# Patient Record
Sex: Male | Born: 1990 | Hispanic: Yes | Marital: Single | Smoking: Never smoker
Health system: Southern US, Community
[De-identification: ages and names within clinical notes are randomized; demographics above are authoritative.]

## PROBLEM LIST (undated history)

## (undated) ENCOUNTER — Emergency Department (HOSPITAL_COMMUNITY): Admission: EM | Payer: Self-pay | Source: Home / Self Care

## (undated) DIAGNOSIS — I639 Cerebral infarction, unspecified: Secondary | ICD-10-CM

## (undated) HISTORY — PX: OTHER SURGICAL HISTORY: SHX169

---

## 2012-01-26 ENCOUNTER — Emergency Department (HOSPITAL_COMMUNITY): Payer: Self-pay

## 2012-01-26 ENCOUNTER — Emergency Department (HOSPITAL_COMMUNITY)
Admission: EM | Admit: 2012-01-26 | Discharge: 2012-01-26 | Disposition: A | Discharge status: Nursing Home (Includes ICF) | Payer: Self-pay | Attending: Emergency Medicine | Admitting: Emergency Medicine

## 2012-01-26 ENCOUNTER — Encounter (HOSPITAL_COMMUNITY): Payer: Self-pay | Admitting: *Deleted

## 2012-01-26 DIAGNOSIS — R404 Transient alteration of awareness: Secondary | ICD-10-CM | POA: Insufficient documentation

## 2012-01-26 DIAGNOSIS — S42109A Fracture of unspecified part of scapula, unspecified shoulder, initial encounter for closed fracture: Secondary | ICD-10-CM

## 2012-01-26 DIAGNOSIS — IMO0002 Reserved for concepts with insufficient information to code with codable children: Secondary | ICD-10-CM | POA: Insufficient documentation

## 2012-01-26 DIAGNOSIS — S45101A Unspecified injury of brachial artery, right side, initial encounter: Secondary | ICD-10-CM

## 2012-01-26 DIAGNOSIS — M25519 Pain in unspecified shoulder: Secondary | ICD-10-CM

## 2012-01-26 DIAGNOSIS — S82209B Unspecified fracture of shaft of unspecified tibia, initial encounter for open fracture type I or II: Secondary | ICD-10-CM

## 2012-01-26 DIAGNOSIS — R000 Tachycardia, unspecified: Secondary | ICD-10-CM | POA: Insufficient documentation

## 2012-01-26 DIAGNOSIS — S42009A Fracture of unspecified part of unspecified clavicle, initial encounter for closed fracture: Secondary | ICD-10-CM | POA: Insufficient documentation

## 2012-01-26 DIAGNOSIS — S82409B Unspecified fracture of shaft of unspecified fibula, initial encounter for open fracture type I or II: Secondary | ICD-10-CM

## 2012-01-26 DIAGNOSIS — J939 Pneumothorax, unspecified: Secondary | ICD-10-CM

## 2012-01-26 DIAGNOSIS — T07XXXA Unspecified multiple injuries, initial encounter: Secondary | ICD-10-CM

## 2012-01-26 DIAGNOSIS — S2239XA Fracture of one rib, unspecified side, initial encounter for closed fracture: Secondary | ICD-10-CM

## 2012-01-26 DIAGNOSIS — J9383 Other pneumothorax: Secondary | ICD-10-CM | POA: Insufficient documentation

## 2012-01-26 DIAGNOSIS — S2249XA Multiple fractures of ribs, unspecified side, initial encounter for closed fracture: Secondary | ICD-10-CM

## 2012-01-26 DIAGNOSIS — S82899B Other fracture of unspecified lower leg, initial encounter for open fracture type I or II: Secondary | ICD-10-CM | POA: Insufficient documentation

## 2012-01-26 LAB — URINALYSIS, MICROSCOPIC ONLY
Glucose, UA: NEGATIVE mg/dL
Leukocytes, UA: NEGATIVE
pH: 6.5 (ref 5.0–8.0)

## 2012-01-26 LAB — POCT I-STAT, CHEM 8
BUN: 11 mg/dL (ref 6–23)
Creatinine, Ser: 1.4 mg/dL — ABNORMAL HIGH (ref 0.50–1.35)
Hemoglobin: 15 g/dL (ref 13.0–17.0)
Potassium: 3.5 mEq/L (ref 3.5–5.1)
Sodium: 149 mEq/L — ABNORMAL HIGH (ref 135–145)

## 2012-01-26 LAB — SAMPLE TO BLOOD BANK

## 2012-01-26 LAB — CBC
Hemoglobin: 14.3 g/dL (ref 13.0–17.0)
RBC: 4.68 MIL/uL (ref 4.22–5.81)

## 2012-01-26 LAB — COMPREHENSIVE METABOLIC PANEL
AST: 208 U/L — ABNORMAL HIGH (ref 0–37)
BUN: 11 mg/dL (ref 6–23)
Chloride: 107 mEq/L (ref 96–112)
GFR calc Af Amer: >90 mL/min (ref >90)
GFR calc non Af Amer: >90 mL/min (ref >90)
Potassium: 3.2 mEq/L — ABNORMAL LOW (ref 3.5–5.1)
Sodium: 146 mEq/L — ABNORMAL HIGH (ref 135–145)

## 2012-01-26 MED ORDER — IOHEXOL 300 MG/ML  SOLN
100.0000 mL | Freq: Once | INTRAMUSCULAR | Status: AC | PRN
  Administered 2012-01-26: 100 mL via INTRAVENOUS

## 2012-01-26 MED ORDER — FENTANYL CITRATE 0.05 MG/ML IJ SOLN
50.0000 ug | Freq: Once | INTRAMUSCULAR | Status: AC
  Administered 2012-01-26: 50 ug via INTRAVENOUS
  Filled 2012-01-26: qty 2

## 2012-01-26 MED ORDER — CEFAZOLIN SODIUM 1-5 GM-% IV SOLN
1.0000 g | Freq: Once | INTRAVENOUS | Status: AC
  Administered 2012-01-26: 1 g via INTRAVENOUS
  Filled 2012-01-26: qty 50

## 2012-01-26 MED ORDER — TETANUS-DIPHTH-ACELL PERTUSSIS 5-2.5-18.5 LF-MCG/0.5 IM SUSP
0.5000 mL | Freq: Once | INTRAMUSCULAR | Status: AC
  Administered 2012-01-26: 0.5 mL via INTRAMUSCULAR
  Filled 2012-01-26: qty 0.5

## 2012-01-26 MED ORDER — SODIUM CHLORIDE 0.9 % IV BOLUS (SEPSIS)
1000.0000 mL | Freq: Once | INTRAVENOUS | Status: AC
  Administered 2012-01-26: 1000 mL via INTRAVENOUS

## 2012-01-26 MED ORDER — GENTAMICIN SULFATE 40 MG/ML IJ SOLN
300.0000 mg | Freq: Once | INTRAMUSCULAR | Status: AC
  Administered 2012-01-26: 300 mg via INTRAVENOUS
  Filled 2012-01-26: qty 7.5

## 2012-01-26 NOTE — ED Notes (Signed)
Pt cell phone, wallet, shoes given to friend "ozzie" at the bedside
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
## 2012-01-26 NOTE — ED Notes (Signed)
Ortho tech at bedside, and splint applied to right lower extremity

## 2012-01-26 NOTE — ED Notes (Signed)
Pt transported to CT with RN and monitor.  °

## 2012-01-26 NOTE — ED Notes (Signed)
[employer] at bedside on arrival

## 2012-01-26 NOTE — ED Notes (Signed)
Called [facility] and spoke to [facility] to please let pt friend Jose Yu know that the patient left behind a phone charger and ID and it will be up at front desk waiting on him
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
## 2012-01-26 NOTE — ED Notes (Signed)
Pt arrived back from ct. Dr. Andrey Campanile is at the bedside. Change in patient right radial/brachial pulse noted. Dopplarable only at this time, MD aware of the same.  Also, changing pt to ASPEN collar per orders by MD
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
## 2013-06-23 ENCOUNTER — Ambulatory Visit: Payer: Self-pay

## 2014-12-12 ENCOUNTER — Emergency Department (HOSPITAL_COMMUNITY)
Admission: EM | Admit: 2014-12-12 | Discharge: 2014-12-13 | Disposition: A | Discharge status: Home / Self Care | Payer: Self-pay | Attending: Emergency Medicine | Admitting: Emergency Medicine

## 2014-12-12 ENCOUNTER — Encounter (HOSPITAL_COMMUNITY): Payer: Self-pay | Admitting: Emergency Medicine

## 2014-12-12 DIAGNOSIS — Y92410 Unspecified street and highway as the place of occurrence of the external cause: Secondary | ICD-10-CM | POA: Insufficient documentation

## 2014-12-12 DIAGNOSIS — R000 Tachycardia, unspecified: Secondary | ICD-10-CM | POA: Insufficient documentation

## 2014-12-12 DIAGNOSIS — W1839XA Other fall on same level, initial encounter: Secondary | ICD-10-CM | POA: Insufficient documentation

## 2014-12-12 DIAGNOSIS — S60511A Abrasion of right hand, initial encounter: Secondary | ICD-10-CM | POA: Insufficient documentation

## 2014-12-12 DIAGNOSIS — F919 Conduct disorder, unspecified: Secondary | ICD-10-CM | POA: Insufficient documentation

## 2014-12-12 DIAGNOSIS — F1410 Cocaine abuse, uncomplicated: Secondary | ICD-10-CM

## 2014-12-12 DIAGNOSIS — Z720 Tobacco use: Secondary | ICD-10-CM | POA: Insufficient documentation

## 2014-12-12 DIAGNOSIS — Y998 Other external cause status: Secondary | ICD-10-CM | POA: Insufficient documentation

## 2014-12-12 DIAGNOSIS — S0001XA Abrasion of scalp, initial encounter: Secondary | ICD-10-CM | POA: Insufficient documentation

## 2014-12-12 DIAGNOSIS — Y9302 Activity, running: Secondary | ICD-10-CM | POA: Insufficient documentation
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
## 2014-12-13 LAB — CBC WITH DIFFERENTIAL/PLATELET
BASOS ABS: 0 10*3/uL (ref 0.0–0.1)
Basophils Relative: 0 % (ref 0–1)
Eosinophils Absolute: 0.3 10*3/uL (ref 0.0–0.7)
Eosinophils Relative: 3 % (ref 0–5)
HEMATOCRIT: 47.9 % (ref 39.0–52.0)
HEMOGLOBIN: 16.5 g/dL (ref 13.0–17.0)
LYMPHS ABS: 3.9 10*3/uL (ref 0.7–4.0)
LYMPHS PCT: 45 % (ref 12–46)
MCH: 31.4 pg (ref 26.0–34.0)
MCHC: 34.4 g/dL (ref 30.0–36.0)
MCV: 91.2 fL (ref 78.0–100.0)
Monocytes Absolute: 0.8 10*3/uL (ref 0.1–1.0)
Monocytes Relative: 9 % (ref 3–12)
Neutro Abs: 3.8 10*3/uL (ref 1.7–7.7)
Neutrophils Relative %: 43 % (ref 43–77)
PLATELETS: 232 10*3/uL (ref 150–400)
RBC: 5.25 MIL/uL (ref 4.22–5.81)
RDW: 12.3 % (ref 11.5–15.5)
WBC: 8.8 10*3/uL (ref 4.0–10.5)

## 2014-12-13 LAB — RAPID URINE DRUG SCREEN, HOSP PERFORMED
Amphetamines: NOT DETECTED
BARBITURATES: NOT DETECTED
Benzodiazepines: NOT DETECTED
Cocaine: POSITIVE — AB
Opiates: NOT DETECTED
TETRAHYDROCANNABINOL: NOT DETECTED

## 2014-12-13 LAB — URINALYSIS, ROUTINE W REFLEX MICROSCOPIC
Bilirubin Urine: NEGATIVE
GLUCOSE, UA: NEGATIVE mg/dL
HGB URINE DIPSTICK: NEGATIVE
Ketones, ur: NEGATIVE mg/dL
LEUKOCYTES UA: NEGATIVE
Nitrite: NEGATIVE
PH: 6.5 (ref 5.0–8.0)
PROTEIN: NEGATIVE mg/dL
SPECIFIC GRAVITY, URINE: 1.005 (ref 1.005–1.030)
Urobilinogen, UA: 0.2 mg/dL (ref 0.0–1.0)

## 2014-12-13 LAB — I-STAT CG4 LACTIC ACID, ED
LACTIC ACID, VENOUS: 0.9 mmol/L (ref 0.5–2.0)
LACTIC ACID, VENOUS: 3.41 mmol/L — AB (ref 0.5–2.0)

## 2014-12-13 LAB — COMPREHENSIVE METABOLIC PANEL
ALBUMIN: 4.6 g/dL (ref 3.5–5.0)
ALT: 83 U/L — AB (ref 17–63)
ANION GAP: 15 (ref 5–15)
AST: 98 U/L — AB (ref 15–41)
Alkaline Phosphatase: 172 U/L — ABNORMAL HIGH (ref 38–126)
BILIRUBIN TOTAL: 0.5 mg/dL (ref 0.3–1.2)
BUN: 10 mg/dL (ref 6–20)
CALCIUM: 9.2 mg/dL (ref 8.9–10.3)
CHLORIDE: 108 mmol/L (ref 101–111)
CO2: 22 mmol/L (ref 22–32)
CREATININE: 0.98 mg/dL (ref 0.61–1.24)
Glucose, Bld: 140 mg/dL — ABNORMAL HIGH (ref 70–99)
Potassium: 3.2 mmol/L — ABNORMAL LOW (ref 3.5–5.1)
SODIUM: 145 mmol/L (ref 135–145)
Total Protein: 8.1 g/dL (ref 6.5–8.1)

## 2014-12-13 LAB — ETHANOL: Alcohol, Ethyl (B): 399 mg/dL (ref <5)

## 2014-12-13 MED ORDER — LORAZEPAM 2 MG/ML IJ SOLN
1.0000 mg | Freq: Once | INTRAMUSCULAR | Status: AC
  Administered 2014-12-13: 1 mg via INTRAVENOUS
  Filled 2014-12-13: qty 1

## 2014-12-13 MED ORDER — SODIUM CHLORIDE 0.9 % IV BOLUS (SEPSIS)
1000.0000 mL | INTRAVENOUS | Status: AC
  Administered 2014-12-13: 1000 mL via INTRAVENOUS

## 2014-12-13 MED ORDER — HALOPERIDOL LACTATE 5 MG/ML IJ SOLN
5.0000 mg | INTRAMUSCULAR | Status: AC
  Administered 2014-12-13: 5 mg via INTRAVENOUS
  Filled 2014-12-13: qty 1

## 2014-12-13 MED ORDER — SODIUM CHLORIDE 0.9 % IV BOLUS (SEPSIS)
2000.0000 mL | INTRAVENOUS | Status: AC
  Administered 2014-12-13: 2000 mL via INTRAVENOUS

## 2014-12-13 NOTE — ED Provider Notes (Signed)
CSN: [id number]     Arrival date & time 12/12/14  2339 History   First MD Initiated Contact with Patient 12/12/14 2350     Chief Complaint  Patient presents with  . Altered Mental Status   (Consider location/radiation/quality/duration/timing/severity/associated sxs/prior Treatment) HPI  Level V Caveat    Jose Yu is a 24 yo male presenting with [facility] with altered mental status and tachycardia.  Pt reportedly was found running down the street after being found unresponsive by bystanders.  During transport he was combative with EMS and repeated words in Spanish and threatening gestures.  Using the translator phone, pt can answer his name but repeatedly answers to all other questions "I didn't do anything, I want to go home." " I have answered your questions, I am not answering any more".  [employer] report suspected crack use.    History reviewed. No pertinent past medical history. History reviewed. No pertinent past surgical history. History reviewed. No pertinent family history. History  Substance Use Topics  . Smoking status: Current Some Day Smoker  . Smokeless tobacco: Not on file  . Alcohol Use: Yes     Comment: drinks several times a week    Review of Systems  Unable to perform ROS: Psychiatric disorder    Allergies  Review of patient's allergies indicates no known allergies.  Home Medications   Prior to Admission medications   Not on File   BP 161/87 mmHg  Pulse 142  Temp(Src) 98 F (36.7 C) (Axillary)  Resp 22  SpO2 98% Physical Exam  Constitutional: He appears well-developed and well-nourished. No distress.  HENT:  Head: Normocephalic and atraumatic.  Eyes: Conjunctivae are normal. Pupils are equal, round, and reactive to light.  Neck: Neck supple.  Cardiovascular: Regular rhythm and intact distal pulses.  Tachycardia present.   Pulmonary/Chest: Effort normal and breath sounds normal. No respiratory distress. He has no wheezes. He has no rales. He  exhibits no tenderness.  Abdominal: Soft. There is no tenderness.  Musculoskeletal: He exhibits no tenderness.  Healed surgical scar on right anterior shoulder, right arm atrophy.  Lymphadenopathy:    He has no cervical adenopathy.  Neurological: He is alert.  Skin: Skin is warm and dry. No rash noted. He is not diaphoretic.  Various superficial abrasion to right temporal scalp and dorsal aspect of right hand  Psychiatric: His affect is angry. He is aggressive and combative. He expresses impulsivity.  Nursing note and vitals reviewed.   ED Course  Procedures (including critical care time) Labs Review Labs Reviewed  COMPREHENSIVE METABOLIC PANEL - Abnormal; Notable for the following:    Potassium 3.2 (*)    Glucose, Bld 140 (*)    AST 98 (*)    ALT 83 (*)    Alkaline Phosphatase 172 (*)    All other components within normal limits  URINE RAPID DRUG SCREEN (HOSP PERFORMED) - Abnormal; Notable for the following:    Cocaine POSITIVE (*)    All other components within normal limits  ETHANOL - Abnormal; Notable for the following:    Alcohol, Ethyl (B) 399 (*)    All other components within normal limits  I-STAT CG4 LACTIC ACID, ED - Abnormal; Notable for the following:    Lactic Acid, Venous 3.41 (*)    All other components within normal limits  CBC WITH DIFFERENTIAL/PLATELET  URINALYSIS, ROUTINE W REFLEX MICROSCOPIC  I-STAT CG4 LACTIC ACID, ED    Imaging Review No results found.   EKG Interpretation   Date/Time:  Sunday Dec 12 2014 23:42:35 EDT Ventricular Rate:  138 PR Interval:  136 QRS Duration: 90 QT Interval:  289 QTC Calculation: 438 R Axis:   27 Text Interpretation:  Sinus tachycardia Minimal ST depression, lateral  leads No old tracing to compare Confirmed by [facility]  MD, DAVID ([id number]) on  12/13/2014 12:30:56 AM      MDM   Final diagnoses:  Drug abuse, cocaine type   24 yo clinically intoxicated, Spanish speaking, with tachycardia and unwilling to  cooperate with exam or history. CBC, CMP, Lactic acid, UA, UDS, 2 liter NS. Case discussed with Dr. Roxanne Mins  0:99 AM: Labs reviewed Mildly decreased potassium: 3.2, AST, ALT and alk Phos elevated consistent with alcohol intake. Initial Lactic elevated to 3.41 and ETOH: 399. WDS: positive for cocaine Will continue fluid resuscitation.  Pt increasingly more agitated despite attempts with interpreter phone to re-orient. Pt grabbed staff and was restrained by police and subsequently spit in his face. Pt was placed in restraints and Haldol given.   3:00 AM: Pt sleeping without distress, hypotension after sedation meds reported, additional fluid bolus given.    6:00 AM: Pt more alert,  re-oriented using interpreter phone, ED course and results discussed with pt.  Pt ambulated in ED without difficulty.  Pt denies shortness of breath, chest pain, nausea, headache or other complaint. Pt is well-appearing, in no acute distress and vital signs reviewed and not concerning. He appears safe to be discharged.  Discharge include follow-up with their PCP.  Return precautions provided.  He is discharged from the ED in the custody of the police.     Filed Vitals:   12/13/14 0118 12/13/14 0347 12/13/14 0350 12/13/14 0521  BP: 96/62 83/48 108/44 117/67  Pulse: 119 105 104 116  Temp:      TempSrc:      Resp: _0 SpO2: 100% 98% 97% 95%   Meds given in ED:  Medications  sodium chloride 0.9 % bolus 2,000 mL (0 mLs Intravenous Stopped 12/13/14 0353)  LORazepam (ATIVAN) injection 1 mg (1 mg Intravenous Given 12/13/14 0042)  haloperidol lactate (HALDOL) injection 5 mg (5 mg Intravenous Given 12/13/14 0110)  sodium chloride 0.9 % bolus 1,000 mL (0 mLs Intravenous Stopped 12/13/14 0454)    New Prescriptions   No medications on file       Britt Bottom, NP 83/38/25 0539  Delora Fuel, MD 76/73/41 [id number]

## 2014-12-13 NOTE — Discharge Instructions (Signed)
Please follow the directions provided. Use the resource guide to follow-up with a primary care doctor. Don't hesitate to return for any new, worsening or concerning symptoms.      SEEK IMMEDIATE MEDICAL CARE IF:  You have serious thoughts about hurting yourself or others.  You have a seizure, chest pain, sudden weakness, or loss of speech or vision.

## 2014-12-13 NOTE — ED Notes (Signed)
NP Beth notified of BP. NS Bolus verbal order obtained will continue to monitor.

## 2014-12-13 NOTE — ED Notes (Signed)
Pt alert, oriented, and ambulatory upon DC. He is discharged in the care of [employer]. Interpreter phone used for discharged instructions.

## 2014-12-13 NOTE — ED Notes (Signed)
Pt sleeping. Rise and fall noted to chest. Pt responds to stimuli

## 2017-05-11 ENCOUNTER — Encounter (HOSPITAL_COMMUNITY): Payer: Self-pay | Admitting: Emergency Medicine

## 2017-05-11 ENCOUNTER — Emergency Department (HOSPITAL_COMMUNITY)
Admission: EM | Admit: 2017-05-11 | Discharge: 2017-05-11 | Disposition: A | Discharge status: Home / Self Care | Payer: Self-pay | Attending: Emergency Medicine | Admitting: Emergency Medicine

## 2017-05-11 DIAGNOSIS — M79642 Pain in left hand: Secondary | ICD-10-CM | POA: Insufficient documentation

## 2017-05-11 DIAGNOSIS — Z5321 Procedure and treatment not carried out due to patient leaving prior to being seen by health care provider: Secondary | ICD-10-CM | POA: Insufficient documentation

## 2017-05-11 NOTE — ED Triage Notes (Addendum)
Patient called EMS due to having a cat bite to his left hand. Patient does not speak english. Patient is complaining of pain in the left hand. Brothers are at bedside.

## 2017-05-11 NOTE — ED Notes (Signed)
Patient left with brothers because he did not want to wait anymore.

## 2017-05-11 NOTE — ED Provider Notes (Signed)
1:45 AM I went to see patient and he had left without being seen. I did not evaluate this patient.    Pricilla Loveless, MD 05/11/17 [phone]

## 2017-05-11 NOTE — ED Notes (Signed)
Patient states he was bit by a cat but patient came by EMS. Patient does not want to be seen. Patient is intoxicated and will be released with brother when he is discharged.

## 2017-05-11 NOTE — ED Notes (Signed)
Bed: WA03 Expected date:  Expected time:  Means of arrival:  Comments: 

## 2017-08-02 ENCOUNTER — Encounter (HOSPITAL_COMMUNITY): Payer: Self-pay

## 2017-08-02 ENCOUNTER — Emergency Department (HOSPITAL_COMMUNITY): Payer: Self-pay

## 2017-08-02 ENCOUNTER — Emergency Department (HOSPITAL_COMMUNITY)
Admission: EM | Admit: 2017-08-02 | Discharge: 2017-08-02 | Disposition: A | Discharge status: Home / Self Care | Payer: Self-pay | Attending: Emergency Medicine | Admitting: Emergency Medicine

## 2017-08-02 ENCOUNTER — Other Ambulatory Visit: Payer: Self-pay

## 2017-08-02 DIAGNOSIS — Y929 Unspecified place or not applicable: Secondary | ICD-10-CM | POA: Insufficient documentation

## 2017-08-02 DIAGNOSIS — Y998 Other external cause status: Secondary | ICD-10-CM | POA: Insufficient documentation

## 2017-08-02 DIAGNOSIS — S82831A Other fracture of upper and lower end of right fibula, initial encounter for closed fracture: Secondary | ICD-10-CM | POA: Insufficient documentation

## 2017-08-02 DIAGNOSIS — S82891A Other fracture of right lower leg, initial encounter for closed fracture: Secondary | ICD-10-CM

## 2017-08-02 DIAGNOSIS — Y939 Activity, unspecified: Secondary | ICD-10-CM | POA: Insufficient documentation

## 2017-08-02 DIAGNOSIS — F17200 Nicotine dependence, unspecified, uncomplicated: Secondary | ICD-10-CM | POA: Insufficient documentation

## 2017-08-02 DIAGNOSIS — Y33XXXA Other specified events, undetermined intent, initial encounter: Secondary | ICD-10-CM | POA: Insufficient documentation

## 2017-08-02 MED ORDER — HYDROCODONE-ACETAMINOPHEN 5-325 MG PO TABS: 1.0000 | ORAL_TABLET | Freq: Four times a day (QID) | ORAL | 0 refills | Status: DC | PRN

## 2017-08-02 NOTE — ED Provider Notes (Signed)
[facility]-EMERGENCY DEPT Provider Note   CSN: [id number] Arrival date & time: 08/02/17  1850     History   Chief Complaint Chief Complaint  Patient presents with  . Ankle Pain    HPI Jose Yu is a 26 y.o. male who presents today for evaluation of sudden onset right ankle pain.  He reports that he stood up and twisted his ankle causing him severe pain.  He reports 10 out of 10 pain in his right ankle.  He reports that he has pain in his ankle along with swelling.  He denies any other injuries, no knee pain, did not fall or strike his head. Interpreter used to obtain HPI.  HPI  History reviewed. No pertinent past medical history.  There are no active problems to display for this patient.   History reviewed. No pertinent surgical history.     Home Medications    Prior to Admission medications   Medication Sig Start Date End Date Taking? Authorizing Provider  HYDROcodone-acetaminophen (NORCO/VICODIN) 5-325 MG tablet Take 1 tablet by mouth every 6 (six) hours as needed. 08/02/17   Cristina GongHammond, Mollye Guinta W, PA-C    Family History History reviewed. No pertinent family history.  Social History Social History   Tobacco Use  . Smoking status: Current Some Day Smoker  . Smokeless tobacco: Never Used  Substance Use Topics  . Alcohol use: Yes    Comment: drinks several times a week  . Drug use: Yes    Comment: rare THC     Allergies   Patient has no known allergies.   Review of Systems Review of Systems  Musculoskeletal: Positive for arthralgias, gait problem and joint swelling.  Skin: Negative for color change, rash and wound.  Neurological: Negative for weakness and numbness.  All other systems reviewed and are negative.    Physical Exam Updated Vital Signs BP (!) 154/81 (BP Location: Right Arm)   Pulse 81   Temp 97.8 F (36.6 C) (Oral)   Resp 14   SpO2 97%   Physical Exam  Constitutional: He appears well-developed and  well-nourished. No distress.  HENT:  Head: Normocephalic and atraumatic.  Eyes: Conjunctivae are normal. Right eye exhibits no discharge. Left eye exhibits no discharge. No scleral icterus.  Neck: Normal range of motion.  Cardiovascular: Normal rate, regular rhythm and intact distal pulses.  2+ DP/PT pulses on right side  Pulmonary/Chest: Effort normal. No stridor. No respiratory distress.  Abdominal: He exhibits no distension.  Musculoskeletal: He exhibits no edema or deformity.  Mild swelling to right ankle bilaterally.  Patient is able to wiggle his toes.  Right lower leg compartments are soft and easily compressible.  Neurological: He is alert. No sensory deficit. He exhibits normal muscle tone.  Sensation intact to right lower extremity, toes, and foot.   Skin: Skin is warm and dry. He is not diaphoretic.  Psychiatric: He has a normal mood and affect. His behavior is normal.  Nursing note and vitals reviewed.    ED Treatments / Results  Labs (all labs ordered are listed, but only abnormal results are displayed) Labs Reviewed - No data to display  EKG  EKG Interpretation None       Radiology Dg Ankle Complete Right  Result Date: 08/02/2017 CLINICAL DATA:  26 year old male with fall and right ankle twisting. EXAM: RIGHT ANKLE - COMPLETE 3+ VIEW COMPARISON:  Right ankle radiograph dated 01/26/2012 FINDINGS: There is a mildly displaced oblique acute fracture of the distal fibula. Apparent  cortical discontinuity of the posterior malleolus on the lateral view may be artifactual or less likely represent a mildly displaced fracture. This area is difficult to assess due to suboptimal visualization. Old healed fracture of the mid tibia and fibula as well as partially visualized intramedullary rod and distal fixation screws in the tibia. There is approximately 5 mm lateral subluxation of the ankle mortise. There is diffuse soft tissue swelling of the ankle. IMPRESSION: 1. Acute displaced  oblique fracture of the distal fibula. 2. Artifact versus less likely a mildly displaced fracture of the posterior malleolus of the distal tibia. 3. A 5 mm lateral subluxation of the ankle. Electronically Signed   By: Elgie CollardArash  Radparvar M.D.   On: 08/02/2017 19:58    Procedures Procedures (including critical care time)  Medications Ordered in ED Medications - No data to display   Initial Impression / Assessment and Plan / ED Course  I have reviewed the triage vital signs and the nursing notes.  Pertinent labs & imaging results that were available during my care of the patient were reviewed by me and considered in my medical decision making (see chart for details).    Jose Yu presents today for evaluation of sudden onset right ankle pain when he stood up and twisted his ankle to turn.  X-rays were obtained showing fracture.  Placement is unlikely to be corrected by ER reduction.  Patient pain was treated prior to arrival with fentanyl.  He is right foot was neurovascularly intact, and he does not have any proximal right lower leg pain/knee pain.  He was given a Cam walker and crutches.  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  A platform walker was discussed with the patient, however he declined.  Patient was instructed to be nonweightbearing and informed that failure to follow instructions could result in worsening fracture.  He was also informed that his blood pressure was high and he needed to obtain follow-up regarding this.  He was given a short course of Norco for his pain, along with instructions and appropriately alternating ibuprofen and Tylenol, which were explained through interpreter.  Orthopedics follow-up.  Discharged home.  Return precautions given.   Final Clinical Impressions(s) / ED Diagnoses   Final diagnoses:  Closed fracture of right ankle, initial encounter    ED Discharge Orders         Ordered    HYDROcodone-acetaminophen (NORCO/VICODIN) 5-325 MG tablet  Every 6 hours PRN     08/02/17 2205       Cristina GongHammond, [place] Doig W, PA-C 08/03/17 0250    Linwood DibblesKnapp, Jon, MD 08/03/17 [phone]

## 2017-08-02 NOTE — ED Triage Notes (Signed)
Patient BIB EMS with right ankle pain. Patient states he was sitting and when he stood up, he twisted his ankle to turn and fell. Patient complaining of pain 10/10 to his right ankle. Patient has history of previous fracture with pins to right ankle. 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 EMS placed 18G L AC- 200mcg IV Fentanyl administered en route. Swelling noted to right ankle in triage. Patient is spanish speaking only.

## 2017-08-02 NOTE — Discharge Instructions (Signed)

## 2018-12-08 ENCOUNTER — Other Ambulatory Visit: Payer: Self-pay

## 2018-12-08 ENCOUNTER — Emergency Department (HOSPITAL_COMMUNITY): Payer: Self-pay

## 2018-12-08 ENCOUNTER — Encounter (HOSPITAL_COMMUNITY): Payer: Self-pay | Admitting: Emergency Medicine

## 2018-12-08 ENCOUNTER — Emergency Department (HOSPITAL_COMMUNITY)
Admission: EM | Admit: 2018-12-08 | Discharge: 2018-12-08 | Disposition: A | Discharge status: Home / Self Care | Payer: Self-pay | Attending: Emergency Medicine | Admitting: Emergency Medicine

## 2018-12-08 DIAGNOSIS — Z23 Encounter for immunization: Secondary | ICD-10-CM | POA: Insufficient documentation

## 2018-12-08 DIAGNOSIS — Y9389 Activity, other specified: Secondary | ICD-10-CM | POA: Insufficient documentation

## 2018-12-08 DIAGNOSIS — Y999 Unspecified external cause status: Secondary | ICD-10-CM | POA: Insufficient documentation

## 2018-12-08 DIAGNOSIS — Y929 Unspecified place or not applicable: Secondary | ICD-10-CM | POA: Insufficient documentation

## 2018-12-08 DIAGNOSIS — W25XXXA Contact with sharp glass, initial encounter: Secondary | ICD-10-CM | POA: Insufficient documentation

## 2018-12-08 DIAGNOSIS — F17210 Nicotine dependence, cigarettes, uncomplicated: Secondary | ICD-10-CM | POA: Insufficient documentation

## 2018-12-08 DIAGNOSIS — S61412A Laceration without foreign body of left hand, initial encounter: Secondary | ICD-10-CM | POA: Insufficient documentation

## 2018-12-08 LAB — CBC WITH DIFFERENTIAL/PLATELET
Abs Immature Granulocytes: 0.01 10*3/uL (ref 0.00–0.07)
Basophils Absolute: 0.1 10*3/uL (ref 0.0–0.1)
Basophils Relative: 1 %
Eosinophils Absolute: 0.1 10*3/uL (ref 0.0–0.5)
Eosinophils Relative: 3 %
HCT: 46.3 % (ref 39.0–52.0)
Hemoglobin: 15.7 g/dL (ref 13.0–17.0)
Immature Granulocytes: 0 %
Lymphocytes Relative: 46 %
Lymphs Abs: 2.4 10*3/uL (ref 0.7–4.0)
MCH: 31.7 pg (ref 26.0–34.0)
MCHC: 33.9 g/dL (ref 30.0–36.0)
MCV: 93.5 fL (ref 80.0–100.0)
Monocytes Absolute: 0.5 10*3/uL (ref 0.1–1.0)
Monocytes Relative: 10 %
Neutro Abs: 2 10*3/uL (ref 1.7–7.7)
Neutrophils Relative %: 40 %
Platelets: 239 10*3/uL (ref 150–400)
RBC: 4.95 MIL/uL (ref 4.22–5.81)
RDW: 12.9 % (ref 11.5–15.5)
WBC: 5.1 10*3/uL (ref 4.0–10.5)
nRBC: 0 % (ref 0.0–0.2)

## 2018-12-08 MED ORDER — TETANUS-DIPHTH-ACELL PERTUSSIS 5-2.5-18.5 LF-MCG/0.5 IM SUSP
0.5000 mL | Freq: Once | INTRAMUSCULAR | Status: AC
Start: 2018-12-08 — End: 2018-12-08
  Administered 2018-12-08: 0.5 mL via INTRAMUSCULAR
  Filled 2018-12-08: qty 0.5

## 2018-12-08 MED ORDER — LIDOCAINE-EPINEPHRINE (PF) 2 %-1:200000 IJ SOLN
10.0000 mL | Freq: Once | INTRAMUSCULAR | Status: AC
  Administered 2018-12-08: 10 mL via INTRADERMAL
  Filled 2018-12-08: qty 20

## 2018-12-08 NOTE — Discharge Instructions (Addendum)
Suture removal in 7 days. Come back to the ER or go to the urgent care to have your suture removed.

## 2018-12-08 NOTE — ED Provider Notes (Signed)
[facility] Provider Note   CSN: [id number] Arrival date & time: 12/08/18  1813    History   Chief Complaint Chief Complaint  Patient presents with  . Extremity Laceration    HPI Renne CriglerMacario Andrade-Cano is a 28 y.o. male.     28yo male brought in by EMS for left hand laceration after punching a window today, reports heavy bleeding on scene. No other injuries, states he punched the window because he was mad. Last tdap unknown.      History reviewed. No pertinent past medical history.  There are no active problems to display for this patient.   History reviewed. No pertinent surgical history.      Home Medications    Prior to Admission medications   Medication Sig Start Date End Date Taking? Authorizing Provider  HYDROcodone-acetaminophen (NORCO/VICODIN) 5-325 MG tablet Take 1 tablet by mouth every 6 (six) hours as needed. 08/02/17   Cristina GongHammond, Elizabeth W, PA-C    Family History History reviewed. No pertinent family history.  Social History Social History   Tobacco Use  . Smoking status: Current Some Day Smoker  . Smokeless tobacco: Never Used  Substance Use Topics  . Alcohol use: Yes    Comment: drinks several times a week  . Drug use: Yes    Comment: rare THC     Allergies   Patient has no known allergies.   Review of Systems Review of Systems  Constitutional: Negative for fever.  Musculoskeletal: Positive for myalgias.  Skin: Positive for wound.  Allergic/Immunologic: Negative for immunocompromised state.  Neurological: Negative for weakness and numbness.  All other systems reviewed and are negative.    Physical Exam Updated Vital Signs BP 115/78   Pulse 99   Temp 98.9 F (37.2 C) (Oral)   Resp 15   SpO2 96%   Physical Exam Vitals signs and nursing note reviewed.  Constitutional:      General: He is not in acute distress.    Appearance: He is well-developed. He is not diaphoretic.  HENT:   Head: Normocephalic and atraumatic.  Cardiovascular:     Pulses: Normal pulses.  Pulmonary:     Effort: Pulmonary effort is normal.  Musculoskeletal: Normal range of motion.        General: Swelling, tenderness and signs of injury present.       Arms:       Hands:  Skin:    General: Skin is warm and dry.     Capillary Refill: Capillary refill takes less than 2 seconds.  Neurological:     General: No focal deficit present.     Mental Status: He is alert and oriented to person, place, and time.  Psychiatric:        Behavior: Behavior normal.      ED Treatments / Results  Labs (all labs ordered are listed, but only abnormal results are displayed) Labs Reviewed  CBC WITH DIFFERENTIAL/PLATELET    EKG None  Radiology Dg Hand Complete Left  Result Date: 12/08/2018 CLINICAL DATA:  28 y/o  M; laceration. EXAM: LEFT HAND - COMPLETE 3+ VIEW COMPARISON:  None. FINDINGS: There is no evidence of fracture or dislocation. There is no evidence of arthropathy or other focal bone abnormality. No radiopaque foreign body identified. IMPRESSION: 1.  No acute fracture or dislocation. 2. No radiopaque foreign body identified. Electronically Signed   By: Mitzi HansenLance  Furusawa-Stratton M.D.   On: 12/08/2018 19:10    Procedures .Marland Kitchen.Laceration Repair Date/Time: 12/08/2018 8:40  PM Performed by: Jeannie Fend, PA-C Authorized by: Jeannie Fend, PA-C   Consent:    Consent obtained:  Verbal   Consent given by:  Patient   Risks discussed:  Infection, need for additional repair, pain, poor cosmetic result and poor wound healing   Alternatives discussed:  No treatment and delayed treatment Universal protocol:    Procedure explained and questions answered to patient or proxy's satisfaction: yes     Relevant documents present and verified: yes     Test results available and properly labeled: yes     Imaging studies available: yes     Required blood products, implants, devices, and special equipment  available: yes     Site/side marked: yes     Immediately prior to procedure, a time out was called: yes     Patient identity confirmed:  Verbally with patient Anesthesia (see MAR for exact dosages):    Anesthesia method:  Local infiltration   Local anesthetic:  Lidocaine 2% WITH epi Laceration details:    Location:  Hand   Hand location:  L hand, dorsum   Length (cm):  0.4   Depth (mm):  3 Repair type:    Repair type:  Simple Pre-procedure details:    Preparation:  Patient was prepped and draped in usual sterile fashion and imaging obtained to evaluate for foreign bodies Exploration:    Hemostasis achieved with:  Epinephrine   Wound exploration: wound explored through full range of motion and entire depth of wound probed and visualized     Wound extent: no foreign bodies/material noted, no muscle damage noted and no underlying fracture noted     Contaminated: no   Treatment:    Area cleansed with:  Saline   Amount of cleaning:  Standard   Irrigation solution:  Sterile saline Skin repair:    Repair method:  Sutures   Suture size:  4-0   Suture material:  Nylon   Suture technique:  Simple interrupted   Number of sutures:  1 Approximation:    Approximation:  Close Post-procedure details:    Dressing:  Bulky dressing   Patient tolerance of procedure:  Tolerated well, no immediate complications   (including critical care time)  Medications Ordered in ED Medications  Tdap (BOOSTRIX) injection 0.5 mL (0.5 mLs Intramuscular Given 12/08/18 1935)  lidocaine-EPINEPHrine (XYLOCAINE W/EPI) 2 %-1:200000 (PF) injection 10 mL (10 mLs Intradermal Given by Other 12/08/18 1939)     Initial Impression / Assessment and Plan / ED Course  I have reviewed the triage vital signs and the nursing notes.  Pertinent labs & imaging results that were available during my care of the patient were reviewed by me and considered in my medical decision making (see chart for details).  Clinical Course as of  Dec 08 2043  Mon Dec 08, 2018  4747 28 year old male with laceration to the dorsum of the left hand.  Bleeding controlled upon arrival. Called to room, staff was cleaning wound when bleeding resumed, noted to be heavy, bleeding controlled with pressure. CBC checked due to history of alcohol use, prolonged/heavy bleeding, CBC WNL. Wound irrigated, anesthetized with lidocaine with epi, bleeding controlled, closed with 1 suture. Recommend removal in 1 week with UC/PCP/ER. Tdap updated.    [LM]    Clinical Course User Index [LM] Jeannie Fend, PA-C      Final Clinical Impressions(s) / ED Diagnoses   Final diagnoses:  Laceration of left hand without foreign body, initial encounter  ED Discharge Orders    None       Alden Hipp 12/08/18 2045    Gerhard Munch, MD 12/09/18 [place] Bruns

## 2018-12-08 NOTE — ED Triage Notes (Signed)
Pt arrives to ED from home with complaints of punching through a glass window. EMS reports laceration to patients left hand. Bleeding is currently controlled and pulses are palpable.

## 2018-12-17 ENCOUNTER — Telehealth: Payer: Self-pay | Admitting: *Deleted

## 2018-12-17 NOTE — Telephone Encounter (Signed)
No answer. Left VM to return call. Possible exposure on 12/08/18.

## 2019-10-29 ENCOUNTER — Other Ambulatory Visit: Payer: Self-pay

## 2019-10-29 ENCOUNTER — Emergency Department (HOSPITAL_COMMUNITY): Payer: Self-pay

## 2019-10-29 ENCOUNTER — Emergency Department (HOSPITAL_COMMUNITY)
Admission: EM | Admit: 2019-10-29 | Discharge: 2019-10-29 | Disposition: A | Discharge status: Home / Self Care | Payer: Self-pay | Attending: Emergency Medicine | Admitting: Emergency Medicine

## 2019-10-29 DIAGNOSIS — S0181XA Laceration without foreign body of other part of head, initial encounter: Secondary | ICD-10-CM | POA: Insufficient documentation

## 2019-10-29 DIAGNOSIS — W19XXXA Unspecified fall, initial encounter: Secondary | ICD-10-CM

## 2019-10-29 DIAGNOSIS — Y999 Unspecified external cause status: Secondary | ICD-10-CM | POA: Insufficient documentation

## 2019-10-29 DIAGNOSIS — F17210 Nicotine dependence, cigarettes, uncomplicated: Secondary | ICD-10-CM | POA: Insufficient documentation

## 2019-10-29 DIAGNOSIS — Y92019 Unspecified place in single-family (private) house as the place of occurrence of the external cause: Secondary | ICD-10-CM | POA: Insufficient documentation

## 2019-10-29 DIAGNOSIS — Y939 Activity, unspecified: Secondary | ICD-10-CM | POA: Insufficient documentation

## 2019-10-29 DIAGNOSIS — Z23 Encounter for immunization: Secondary | ICD-10-CM | POA: Insufficient documentation

## 2019-10-29 DIAGNOSIS — W01198A Fall on same level from slipping, tripping and stumbling with subsequent striking against other object, initial encounter: Secondary | ICD-10-CM | POA: Insufficient documentation

## 2019-10-29 LAB — CBC WITH DIFFERENTIAL/PLATELET
Abs Immature Granulocytes: 0.02 10*3/uL (ref 0.00–0.07)
Basophils Absolute: 0.1 10*3/uL (ref 0.0–0.1)
Basophils Relative: 1 %
Eosinophils Absolute: 0.2 10*3/uL (ref 0.0–0.5)
Eosinophils Relative: 3 %
HCT: 46.5 % (ref 39.0–52.0)
Hemoglobin: 16 g/dL (ref 13.0–17.0)
Immature Granulocytes: 0 %
Lymphocytes Relative: 48 %
Lymphs Abs: 3 10*3/uL (ref 0.7–4.0)
MCH: 30.5 pg (ref 26.0–34.0)
MCHC: 34.4 g/dL (ref 30.0–36.0)
MCV: 88.6 fL (ref 80.0–100.0)
Monocytes Absolute: 0.6 10*3/uL (ref 0.1–1.0)
Monocytes Relative: 9 %
Neutro Abs: 2.5 10*3/uL (ref 1.7–7.7)
Neutrophils Relative %: 39 %
Platelets: 182 10*3/uL (ref 150–400)
RBC: 5.25 MIL/uL (ref 4.22–5.81)
RDW: 13.2 % (ref 11.5–15.5)
WBC: 6.3 10*3/uL (ref 4.0–10.5)
nRBC: 0 % (ref 0.0–0.2)

## 2019-10-29 MED ORDER — TETANUS-DIPHTH-ACELL PERTUSSIS 5-2.5-18.5 LF-MCG/0.5 IM SUSP
0.5000 mL | Freq: Once | INTRAMUSCULAR | Status: AC
  Administered 2019-10-29: 0.5 mL via INTRAMUSCULAR
  Filled 2019-10-29: qty 0.5

## 2019-10-29 MED ORDER — LIDOCAINE-EPINEPHRINE (PF) 2 %-1:200000 IJ SOLN
10.0000 mL | Freq: Once | INTRAMUSCULAR | Status: AC
  Administered 2019-10-29: 10 mL
  Filled 2019-10-29: qty 20

## 2019-10-29 MED ORDER — SODIUM CHLORIDE 0.9 % IV BOLUS
1000.0000 mL | Freq: Once | INTRAVENOUS | Status: AC
  Administered 2019-10-29: 1000 mL via INTRAVENOUS

## 2019-10-29 MED ORDER — LIDOCAINE-EPINEPHRINE-TETRACAINE (LET) TOPICAL GEL
3.0000 mL | Freq: Once | TOPICAL | Status: AC
  Administered 2019-10-29: 3 mL via TOPICAL
  Filled 2019-10-29: qty 3

## 2019-10-29 NOTE — ED Triage Notes (Signed)
Per EMS-they found patient "standing in a pool of blood" in his house. They were unable to communicate with him d/t language barrier (speaks only [place]). Pt has 1.5 in lac to central forehead, guaze applied. C collar applied-unknown fall, ETOH, etc. Pt ambulatory on scene.

## 2019-10-29 NOTE — Discharge Instructions (Addendum)
Wound check in 2 days. Suture removal in 7 days. You can go to any urgent care or return to the ER in 7 days to have your sutures removed.

## 2019-10-29 NOTE — ED Provider Notes (Signed)
[facility] EMERGENCY DEPARTMENT Provider Note   CSN: [id number] Arrival date & time: 10/29/19  3785     History No chief complaint on file.   Jose Yu is a 29 y.o. male.  29 year old male brought in by EMS for laceration to the forehead.  Patient states that he tripped and fell yesterday after work while at home, struck his head on a plate resulting in a laceration to his right forehead.  Patient states he thought he was going to be fine without stitches however bleeding returned in the middle the night and so he came to the hospital for stitches today.  Patient reports drinking his usual amount of alcohol daily, states that he drinks 1-2 beers after work.  Patient is not prescribed any blood thinners, denies nausea, vomiting, headache, visual disturbance, denies any other injuries.  Last tetanus unknown.  No other complaints or concerns.  The history is limited by a language barrier. A language interpreter was used (spanish).       No past medical history on file.  There are no problems to display for this patient.   No past surgical history on file.     No family history on file.  Social History   Tobacco Use  . Smoking status: Current Some Day Smoker  . Smokeless tobacco: Never Used  Substance Use Topics  . Alcohol use: Yes    Comment: drinks several times a week  . Drug use: Yes    Comment: rare THC    Home Medications Prior to Admission medications   Medication Sig Start Date End Date Taking? Authorizing Provider  HYDROcodone-acetaminophen (NORCO/VICODIN) 5-325 MG tablet Take 1 tablet by mouth every 6 (six) hours as needed. 08/02/17   Lorin Glass, PA-C    Allergies    Patient has no known allergies.  Review of Systems   Review of Systems  Constitutional: Negative for fever.  Eyes: Negative for visual disturbance.  Gastrointestinal: Negative for nausea and vomiting.  Musculoskeletal: Negative for back pain and neck  pain.  Skin: Positive for wound.  Allergic/Immunologic: Negative for immunocompromised state.  Neurological: Negative for dizziness, weakness and headaches.  Hematological: Does not bruise/bleed easily.  Psychiatric/Behavioral: Negative for confusion.  All other systems reviewed and are negative.   Physical Exam Updated Vital Signs BP (!) 127/91   Pulse 98   Temp 98.3 F (36.8 C) (Oral)   Resp 18   Wt 70 kg   SpO2 99%   BMI 24.91 kg/m   Physical Exam Vitals and nursing note reviewed.  Constitutional:      General: He is not in acute distress.    Appearance: He is well-developed. He is not diaphoretic.    HENT:     Head: Normocephalic.  Eyes:     Extraocular Movements: Extraocular movements intact.     Pupils: Pupils are equal, round, and reactive to light.  Neck:     Comments: c-collar in place Pulmonary:     Effort: Pulmonary effort is normal.  Musculoskeletal:        General: No swelling, tenderness, deformity or signs of injury.     Cervical back: No tenderness.     Thoracic back: No tenderness or bony tenderness.     Lumbar back: No tenderness or bony tenderness.     Right lower leg: No edema.     Left lower leg: No edema.     Comments: Weakness to right arm secondary to prior injury, well healed lengthy  scar to right forearm  Skin:    General: Skin is warm and dry.     Findings: No erythema or rash.  Neurological:     Mental Status: He is alert and oriented to person, place, and time.  Psychiatric:        Behavior: Behavior normal.     ED Results / Procedures / Treatments   Labs (all labs ordered are listed, but only abnormal results are displayed) Labs Reviewed  CBC WITH DIFFERENTIAL/PLATELET    EKG None  Radiology CT Head Wo Contrast  Result Date: 10/29/2019 CLINICAL DATA:  Fall, forehead laceration EXAM: CT HEAD WITHOUT CONTRAST TECHNIQUE: Contiguous axial images were obtained from the base of the skull through the vertex without intravenous  contrast. COMPARISON:  01/26/2012 FINDINGS: Brain: No evidence of acute infarction, hemorrhage, hydrocephalus, extra-axial collection or mass lesion/mass effect. Vascular: No hyperdense vessel or unexpected calcification. Skull: Normal. Negative for fracture or focal lesion. Sinuses/Orbits: No acute finding. Other: None. IMPRESSION: No acute intracranial findings. Electronically Signed   By: Duanne Guess D.O.   On: 10/29/2019 08:01   CT Cervical Spine Wo Contrast  Result Date: 10/29/2019 CLINICAL DATA:  Fall, facial trauma EXAM: CT CERVICAL SPINE WITHOUT CONTRAST TECHNIQUE: Multidetector CT imaging of the cervical spine was performed without intravenous contrast. Multiplanar CT image reconstructions were also generated. COMPARISON:  01/06/2012 FINDINGS: Alignment: Facet joints are aligned without dislocation. Dens and lateral masses aligned. Skull base and vertebrae: No acute fracture. No primary bone lesion or focal pathologic process. Soft tissues and spinal canal: No prevertebral fluid or swelling. No visible canal hematoma. Disc levels: Unremarkable disc spaces. No significant facet arthropathy. No evidence of canal or foraminal stenosis by CT. Upper chest: Lung apices clear. Other: None. IMPRESSION: Negative for fracture or dislocation. Electronically Signed   By: Duanne Guess D.O.   On: 10/29/2019 08:04    Procedures .Marland KitchenLaceration Repair  Date/Time: 10/29/2019 8:58 AM Performed by: Jeannie Fend, PA-C Authorized by: Jeannie Fend, PA-C   Consent:    Consent obtained:  Verbal   Consent given by:  Patient   Risks discussed:  Infection, need for additional repair, pain, poor cosmetic result and poor wound healing   Alternatives discussed:  No treatment and delayed treatment Universal protocol:    Procedure explained and questions answered to patient or proxy's satisfaction: yes     Relevant documents present and verified: yes     Test results available and properly labeled: yes      Imaging studies available: yes     Required blood products, implants, devices, and special equipment available: yes     Site/side marked: yes     Immediately prior to procedure, a time out was called: yes     Patient identity confirmed:  Verbally with patient Anesthesia (see MAR for exact dosages):    Anesthesia method:  Local infiltration and topical application   Topical anesthetic:  LET   Local anesthetic:  Lidocaine 2% WITH epi Laceration details:    Location:  Face   Face location:  R eyebrow   Length (cm):  3   Depth (mm):  5 Repair type:    Repair type:  Simple Pre-procedure details:    Preparation:  Patient was prepped and draped in usual sterile fashion Exploration:    Hemostasis achieved with:  Epinephrine and LET   Wound exploration: wound explored through full range of motion and entire depth of wound probed and visualized     Wound extent:  no foreign bodies/material noted, no muscle damage noted and no underlying fracture noted     Contaminated: no   Treatment:    Area cleansed with:  Saline   Amount of cleaning:  Extensive   Irrigation solution:  Sterile saline Skin repair:    Repair method:  Sutures   Suture size:  5-0   Suture material:  Prolene   Suture technique:  Simple interrupted   Number of sutures:  6 Approximation:    Approximation:  Close Post-procedure details:    Dressing:  Open (no dressing)   Patient tolerance of procedure:  Tolerated well, no immediate complications   (including critical care time)  Medications Ordered in ED Medications  sodium chloride 0.9 % bolus 1,000 mL (1,000 mLs Intravenous New Bag/Given 10/29/19 0741)  Tdap (BOOSTRIX) injection 0.5 mL (0.5 mLs Intramuscular Given 10/29/19 0741)  lidocaine-EPINEPHrine-tetracaine (LET) topical gel (3 mLs Topical Given 10/29/19 0758)  lidocaine-EPINEPHrine (XYLOCAINE W/EPI) 2 %-1:200000 (PF) injection 10 mL (10 mLs Infiltration Given by Other 10/29/19 0745)    ED Course  I have reviewed  the triage vital signs and the nursing notes.  Pertinent labs & imaging results that were available during my care of the patient were reviewed by me and considered in my medical decision making (see chart for details).  Clinical Course as of Oct 28 899  Thu Oct 29, 2019  117 29 year old Spanish-speaking male with laceration to the right forehead brought in by EMS for bleeding throughout the night.  Patient states he tripped and fell into a plate yesterday resulting in a laceration to his face, thought he was going to be okay but then when bleeding returned he felt lightheaded so he called an ambulance.  Patient denies any other injuries, does report alcohol use, CT head and C-spine negative.  H&H checked due to reported feeling lightheaded although thought to be secondary to seeing his blood, CBC within normal limits, vital signs stable.  Bleeding controlled with let and lidocaine with epi, closed with 6 sutures without difficulty.  Recommend wound check in 2 days, suture removal in 7 days.   [LM]    Clinical Course User Index [LM] Alden Hipp   MDM Rules/Calculators/A&P                      Final Clinical Impression(s) / ED Diagnoses Final diagnoses:  Fall, initial encounter  Facial laceration, initial encounter    Rx / DC Orders ED Discharge Orders    None       Jeannie Fend, PA-C 10/29/19 0901    Terald Sleeper, MD 10/29/19 [phone]

## 2021-07-25 ENCOUNTER — Emergency Department (HOSPITAL_COMMUNITY)
Admission: EM | Admit: 2021-07-25 | Discharge: 2021-07-25 | Disposition: A | Discharge status: Home / Self Care | Payer: Self-pay | Attending: Student | Admitting: Student

## 2021-07-25 DIAGNOSIS — Y908 Blood alcohol level of 240 mg/100 ml or more: Secondary | ICD-10-CM | POA: Insufficient documentation

## 2021-07-25 DIAGNOSIS — F17200 Nicotine dependence, unspecified, uncomplicated: Secondary | ICD-10-CM | POA: Insufficient documentation

## 2021-07-25 DIAGNOSIS — F10129 Alcohol abuse with intoxication, unspecified: Secondary | ICD-10-CM | POA: Insufficient documentation

## 2021-07-25 DIAGNOSIS — F10920 Alcohol use, unspecified with intoxication, uncomplicated: Secondary | ICD-10-CM

## 2021-07-25 LAB — COMPREHENSIVE METABOLIC PANEL
ALT: 210 U/L — ABNORMAL HIGH (ref 0–44)
AST: 292 U/L — ABNORMAL HIGH (ref 15–41)
Albumin: 4.5 g/dL (ref 3.5–5.0)
Alkaline Phosphatase: 156 U/L — ABNORMAL HIGH (ref 38–126)
Anion gap: 16 — ABNORMAL HIGH (ref 5–15)
BUN: <5 mg/dL — ABNORMAL LOW (ref 6–20)
CO2: 21 mmol/L — ABNORMAL LOW (ref 22–32)
Calcium: 9 mg/dL (ref 8.9–10.3)
Chloride: 102 mmol/L (ref 98–111)
Creatinine, Ser: 0.47 mg/dL — ABNORMAL LOW (ref 0.61–1.24)
GFR, Estimated: >60 mL/min (ref >60)
Glucose, Bld: 113 mg/dL — ABNORMAL HIGH (ref 70–99)
Potassium: 3.9 mmol/L (ref 3.5–5.1)
Sodium: 139 mmol/L (ref 135–145)
Total Bilirubin: 1.1 mg/dL (ref 0.3–1.2)
Total Protein: 8.3 g/dL — ABNORMAL HIGH (ref 6.5–8.1)

## 2021-07-25 LAB — CBC
HCT: 50.6 % (ref 39.0–52.0)
Hemoglobin: 17.3 g/dL — ABNORMAL HIGH (ref 13.0–17.0)
MCH: 31.5 pg (ref 26.0–34.0)
MCHC: 34.2 g/dL (ref 30.0–36.0)
MCV: 92.2 fL (ref 80.0–100.0)
Platelets: 184 10*3/uL (ref 150–400)
RBC: 5.49 MIL/uL (ref 4.22–5.81)
RDW: 12.5 % (ref 11.5–15.5)
WBC: 4.4 10*3/uL (ref 4.0–10.5)
nRBC: 0 % (ref 0.0–0.2)

## 2021-07-25 LAB — ETHANOL: Alcohol, Ethyl (B): 416 mg/dL (ref <10)

## 2021-07-25 NOTE — ED Provider Notes (Signed)
[facility] EMERGENCY DEPARTMENT Provider Note   CSN: [id number] Arrival date & time: 07/25/21  1601     History Chief Complaint  Patient presents with   Alcohol Intoxication    Jose Yu is a 30 y.o. male.  Pt presents to the ED today with suspected alcohol intoxication.  Pt said he drinks daily.  He has not been getting work and owes money.  He's been depressed, so he keeps drinking.  He was found on near a roadway.  He denies any injuries.   Due to language barrier, an interpreter was present during the history-taking and subsequent discussion (and for part of the physical exam) with this patient.       No past medical history on file.  There are no problems to display for this patient.   No past surgical history on file.     No family history on file.  Social History   Tobacco Use   Smoking status: Some Days   Smokeless tobacco: Never  Vaping Use   Vaping Use: Never used  Substance Use Topics   Alcohol use: Yes    Comment: drinks several times a week   Drug use: Yes    Comment: rare THC    Home Medications Prior to Admission medications   Medication Sig Start Date End Date Taking? Authorizing Provider  HYDROcodone-acetaminophen (NORCO/VICODIN) 5-325 MG tablet Take 1 tablet by mouth every 6 (six) hours as needed. 08/02/17   Cristina Gong, PA-C    Allergies    Patient has no known allergies.  Review of Systems   Review of Systems  All other systems reviewed and are negative.  Physical Exam Updated Vital Signs BP (!) 143/94 (BP Location: Left Arm)    Pulse (!) 104    Temp (!) 97.3 F (36.3 C) (Oral)    Resp 12    SpO2 97%   Physical Exam Vitals and nursing note reviewed.  Constitutional:      Appearance: Normal appearance.  HENT:     Head: Normocephalic and atraumatic.     Right Ear: External ear normal.     Left Ear: External ear normal.     Nose: Nose normal.     Mouth/Throat:     Mouth: Mucous  membranes are dry.  Eyes:     Extraocular Movements: Extraocular movements intact.     Conjunctiva/sclera: Conjunctivae normal.     Pupils: Pupils are equal, round, and reactive to light.  Cardiovascular:     Rate and Rhythm: Normal rate and regular rhythm.     Pulses: Normal pulses.     Heart sounds: Normal heart sounds.  Pulmonary:     Effort: Pulmonary effort is normal.     Breath sounds: Normal breath sounds.  Abdominal:     General: Abdomen is flat. Bowel sounds are normal.     Palpations: Abdomen is soft.  Musculoskeletal:     Cervical back: Normal range of motion and neck supple.     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  Skin:    General: Skin is warm.     Capillary Refill: Capillary refill takes less than 2 seconds.  Neurological:     General: No focal deficit present.     Mental Status: He is alert and oriented to person, place, and time.  Psychiatric:        Mood and Affect: Mood normal.        Behavior: Behavior normal.  ED Results / Procedures / Treatments   Labs (all labs ordered are listed, but only abnormal results are displayed) Labs Reviewed  CBC - Abnormal; Notable for the following components:      Result Value   Hemoglobin 17.3 (*)    All other components within normal limits  COMPREHENSIVE METABOLIC PANEL - Abnormal; Notable for the following components:   CO2 21 (*)    Glucose, Bld 113 (*)    BUN <5 (*)    Creatinine, Ser 0.47 (*)    Total Protein 8.3 (*)    AST 292 (*)    ALT 210 (*)    Alkaline Phosphatase 156 (*)    Anion gap 16 (*)    All other components within normal limits  ETHANOL - Abnormal; Notable for the following components:   Alcohol, Ethyl (B) 416 (*)    All other components within normal limits    EKG None  Radiology No results found.  Procedures Procedures   Medications Ordered in ED Medications - No data to display  ED Course  I have reviewed the triage vital signs and the nursing  notes.  Pertinent labs & imaging results that were available during my care of the patient were reviewed by me and considered in my medical decision making (see chart for details).    MDM Rules/Calculators/A&P                         Pt is awake and alert and able to drink fluids and walk.  His friend is here and can take him home.  Pt is stable for d/c.  Return if worse.  Final Clinical Impression(s) / ED Diagnoses Final diagnoses:  Alcoholic intoxication without complication [facility])    Rx / DC Orders ED Discharge Orders     None        Jacalyn Lefevre, MD 07/25/21 1842

## 2021-07-25 NOTE — ED Provider Notes (Signed)
Emergency Medicine Provider Triage Evaluation Note  Ademide Schaberg , a 30 y.o. male  was evaluated in triage.  Pt complains of suspected alcohol intoxication.  Patient brought in by EMS.  History from EMS.  Patient was found near a roadway near the [place].  There is a report that he drinks 3 40s of beer.  History of alcohol use.  Patient currently somnolent.  No obvious trauma noted.  Review of Systems  Positive: Altered mental status Negative:   Physical Exam  BP (!) 143/94 (BP Location: Left Arm)    Pulse (!) 104    Temp (!) 97.3 F (36.3 C) (Oral)    Resp 12    SpO2 97%  Gen:   Somnolent Resp:  Normal effort  MSK:   Moves extremities without difficulty  Other:  HEENT there is a scaling rash to the scalp, otherwise no signs of hematoma or trauma, no step-offs of the cervical spine, no bruising Medical Decision Making  Medically screening exam initiated at 4:06 PM.  Appropriate orders placed.  Riggin Andrade-Cano was informed that the remainder of the evaluation will be completed by another provider, this initial triage assessment does not replace that evaluation, and the importance of remaining in the ED until their evaluation is complete.  Patient unable to contribute to history at this time.  Labs ordered to confirm alcohol use.   Renne Crigler, PA-C 07/25/21 1607    Glendora Score, MD 07/25/21 [phone]

## 2021-07-25 NOTE — ED Triage Notes (Signed)
Pt via [facility] for ETOH, found on side of the road, advised he was "taking a nap." States he had 3 40's, denies additional substances, no signs of injury/trauma. Pt somnolent but responsive on arrival.

## 2022-01-01 ENCOUNTER — Emergency Department (HOSPITAL_COMMUNITY): Payer: Self-pay

## 2022-01-01 ENCOUNTER — Encounter (HOSPITAL_COMMUNITY): Payer: Self-pay

## 2022-01-01 ENCOUNTER — Emergency Department (HOSPITAL_COMMUNITY)
Admission: EM | Admit: 2022-01-01 | Discharge: 2022-01-01 | Disposition: A | Discharge status: Home / Self Care | Payer: Self-pay | Attending: Emergency Medicine | Admitting: Emergency Medicine

## 2022-01-01 ENCOUNTER — Other Ambulatory Visit: Payer: Self-pay

## 2022-01-01 DIAGNOSIS — X58XXXA Exposure to other specified factors, initial encounter: Secondary | ICD-10-CM | POA: Insufficient documentation

## 2022-01-01 DIAGNOSIS — R454 Irritability and anger: Secondary | ICD-10-CM | POA: Insufficient documentation

## 2022-01-01 DIAGNOSIS — F10921 Alcohol use, unspecified with intoxication delirium: Secondary | ICD-10-CM

## 2022-01-01 DIAGNOSIS — R410 Disorientation, unspecified: Secondary | ICD-10-CM | POA: Insufficient documentation

## 2022-01-01 DIAGNOSIS — R21 Rash and other nonspecific skin eruption: Secondary | ICD-10-CM | POA: Insufficient documentation

## 2022-01-01 DIAGNOSIS — F10121 Alcohol abuse with intoxication delirium: Secondary | ICD-10-CM | POA: Insufficient documentation

## 2022-01-01 DIAGNOSIS — R4781 Slurred speech: Secondary | ICD-10-CM | POA: Insufficient documentation

## 2022-01-01 DIAGNOSIS — Z23 Encounter for immunization: Secondary | ICD-10-CM | POA: Insufficient documentation

## 2022-01-01 DIAGNOSIS — S0001XA Abrasion of scalp, initial encounter: Secondary | ICD-10-CM | POA: Insufficient documentation

## 2022-01-01 DIAGNOSIS — S0990XA Unspecified injury of head, initial encounter: Secondary | ICD-10-CM

## 2022-01-01 DIAGNOSIS — R7401 Elevation of levels of liver transaminase levels: Secondary | ICD-10-CM | POA: Insufficient documentation

## 2022-01-01 DIAGNOSIS — Y908 Blood alcohol level of 240 mg/100 ml or more: Secondary | ICD-10-CM | POA: Insufficient documentation

## 2022-01-01 LAB — CBC
HCT: 45.1 % (ref 39.0–52.0)
Hemoglobin: 15.9 g/dL (ref 13.0–17.0)
MCH: 34 pg (ref 26.0–34.0)
MCHC: 35.3 g/dL (ref 30.0–36.0)
MCV: 96.4 fL (ref 80.0–100.0)
Platelets: 153 10*3/uL (ref 150–400)
RBC: 4.68 MIL/uL (ref 4.22–5.81)
RDW: 12.8 % (ref 11.5–15.5)
WBC: 5.7 10*3/uL (ref 4.0–10.5)
nRBC: 0 % (ref 0.0–0.2)

## 2022-01-01 LAB — RAPID URINE DRUG SCREEN, HOSP PERFORMED
Amphetamines: NOT DETECTED
Barbiturates: NOT DETECTED
Benzodiazepines: NOT DETECTED
Cocaine: NOT DETECTED
Opiates: NOT DETECTED
Tetrahydrocannabinol: NOT DETECTED

## 2022-01-01 LAB — COMPREHENSIVE METABOLIC PANEL
ALT: 91 U/L — ABNORMAL HIGH (ref 0–44)
AST: 132 U/L — ABNORMAL HIGH (ref 15–41)
Albumin: 4.7 g/dL (ref 3.5–5.0)
Alkaline Phosphatase: 137 U/L — ABNORMAL HIGH (ref 38–126)
Anion gap: 10 (ref 5–15)
BUN: 6 mg/dL (ref 6–20)
CO2: 24 mmol/L (ref 22–32)
Calcium: 8.9 mg/dL (ref 8.9–10.3)
Chloride: 109 mmol/L (ref 98–111)
Creatinine, Ser: 0.49 mg/dL — ABNORMAL LOW (ref 0.61–1.24)
GFR, Estimated: >60 mL/min (ref >60)
Glucose, Bld: 116 mg/dL — ABNORMAL HIGH (ref 70–99)
Potassium: 3.8 mmol/L (ref 3.5–5.1)
Sodium: 143 mmol/L (ref 135–145)
Total Bilirubin: 0.6 mg/dL (ref 0.3–1.2)
Total Protein: 8.7 g/dL — ABNORMAL HIGH (ref 6.5–8.1)

## 2022-01-01 LAB — I-STAT CHEM 8, ED
BUN: 4 mg/dL — ABNORMAL LOW (ref 6–20)
Calcium, Ion: 0.96 mmol/L — ABNORMAL LOW (ref 1.15–1.40)
Chloride: 107 mmol/L (ref 98–111)
Creatinine, Ser: 0.9 mg/dL (ref 0.61–1.24)
Glucose, Bld: 116 mg/dL — ABNORMAL HIGH (ref 70–99)
HCT: 48 % (ref 39.0–52.0)
Hemoglobin: 16.3 g/dL (ref 13.0–17.0)
Potassium: 4.6 mmol/L (ref 3.5–5.1)
Sodium: 142 mmol/L (ref 135–145)
TCO2: 24 mmol/L (ref 22–32)

## 2022-01-01 LAB — URINALYSIS, ROUTINE W REFLEX MICROSCOPIC
Bilirubin Urine: NEGATIVE
Glucose, UA: NEGATIVE mg/dL
Hgb urine dipstick: NEGATIVE
Ketones, ur: NEGATIVE mg/dL
Leukocytes,Ua: NEGATIVE
Nitrite: NEGATIVE
Protein, ur: NEGATIVE mg/dL
Specific Gravity, Urine: 1.001 — ABNORMAL LOW (ref 1.005–1.030)
pH: 6 (ref 5.0–8.0)

## 2022-01-01 LAB — ETHANOL: Alcohol, Ethyl (B): 478 mg/dL (ref <10)

## 2022-01-01 LAB — PROTIME-INR
INR: 0.9 (ref 0.8–1.2)
Prothrombin Time: 12.5 seconds (ref 11.4–15.2)

## 2022-01-01 MED ORDER — LACTATED RINGERS IV BOLUS
1000.0000 mL | Freq: Once | INTRAVENOUS | Status: AC
  Administered 2022-01-01: 1000 mL via INTRAVENOUS

## 2022-01-01 MED ORDER — TETANUS-DIPHTH-ACELL PERTUSSIS 5-2.5-18.5 LF-MCG/0.5 IM SUSY
0.5000 mL | PREFILLED_SYRINGE | Freq: Once | INTRAMUSCULAR | Status: AC
  Administered 2022-01-01: 0.5 mL via INTRAMUSCULAR
  Filled 2022-01-01: qty 0.5

## 2022-01-01 MED ORDER — THIAMINE HCL 100 MG/ML IJ SOLN
100.0000 mg | Freq: Once | INTRAMUSCULAR | Status: AC
  Administered 2022-01-01: 100 mg via INTRAVENOUS
  Filled 2022-01-01: qty 2

## 2022-01-01 NOTE — ED Notes (Signed)
Discharge paperwork reviewed and given to patient using spanish interpreter. Pt. Verbalized understanding of paperwork and has no other questions for the care team. Pt. Was handed a cab voucher with instructions. Pt. Left ED.

## 2022-01-01 NOTE — ED Provider Notes (Signed)
Signout from Dr. Durwin Nora.  Patient brought in by EMS found outside on the ground.  History of alcohol abuse.  Alcohol here markedly elevated.  Signs of head injury.  CT head and C-spine with scalp injury no intracranial injury no fracture.  Labs consistent with alcoholic abuse. Physical Exam  BP 117/72   Pulse 100   Temp 97.6 F (36.4 C) (Oral)   Resp 16   Ht 5\' 8"  (1.727 m)   Wt 70 kg   SpO2 99%   BMI 23.46 kg/m   Physical Exam  Procedures  Procedures  ED Course / MDM    Medical Decision Making Amount and/or Complexity of Data Reviewed Labs: ordered. Radiology: ordered. ECG/medicine tests: ordered.  Risk Prescription drug management.   Plan is to reassess when more clinically sober.  Patient is primarily Spanish-speaking.  Patient has been observed for 8 hours total.  He is awake alert tolerating p.o.  Anticipate discharge soon.       , MD 01/02/22 [phone]

## 2022-01-01 NOTE — ED Provider Notes (Signed)
[facility]-EMERGENCY DEPT Provider Note   CSN: [id number] Arrival date & time: 01/01/22  1017     History  Chief Complaint  Patient presents with   Alcohol Intoxication   Head Laceration    Jose Yu is a 31 y.o. male.  The history is provided by the patient. The history is limited by a language barrier. A language interpreter was used.  Patient presents after a suspected fall and alcohol intoxication.  Medical history includes alcohol abuse.  He arrives via EMS.  History is provided by EMS.  They report that he was found outside, on the ground.  There was some fresh blood on the back of his scalp.  He was with a friend who stated that he had been drinking.  Telephone interpreter was utilized to get history on the scene.  Patient reportedly became agitated with the interpreter and refused to answer any questions.  On arrival in the ED, patient continues to not provide history.  When asked what happened, patient states "many things have happened in my life".  When asked what brought him to the hospital, patient states that he felt weak.  When asked if he had a fall, this patient states that "I have had many falls in my life".  When asked if he fell this morning, patient refuses to answer.  Patient states that he feels "death is taking a hold of me".    Home Medications Prior to Admission medications   Medication Sig Start Date End Date Taking? Authorizing Provider  HYDROcodone-acetaminophen (NORCO/VICODIN) 5-325 MG tablet Take 1 tablet by mouth every 6 (six) hours as needed. 08/02/17   Cristina Gong, PA-C      Allergies    Patient has no known allergies.    Review of Systems   Review of Systems  Unable to perform ROS: Mental status change   Physical Exam Updated Vital Signs BP (!) 111/58   Pulse 92   Temp 97.6 F (36.4 C) (Oral)   Resp 18   Ht 5\' 8"  (1.727 m)   Wt 70 kg   SpO2 98%   BMI 23.46 kg/m  Physical Exam Vitals and nursing  note reviewed.  Constitutional:      General: He is not in acute distress.    Appearance: He is well-developed and normal weight. He is not ill-appearing, toxic-appearing or diaphoretic.  HENT:     Head: Normocephalic.     Comments: Abrasion with some dried blood on posterior scalp    Right Ear: External ear normal.     Left Ear: External ear normal.     Nose: Nose normal.     Mouth/Throat:     Mouth: Mucous membranes are moist.     Pharynx: Oropharynx is clear.  Eyes:     Extraocular Movements: Extraocular movements intact.     Conjunctiva/sclera: Conjunctivae normal.     Comments: Conjunctival injection, greater on the right  Neck:     Comments: Cervical collar in place Cardiovascular:     Rate and Rhythm: Normal rate and regular rhythm.     Heart sounds: No murmur heard. Pulmonary:     Effort: Pulmonary effort is normal. No respiratory distress.     Breath sounds: Normal breath sounds. No wheezing or rales.  Chest:     Chest wall: No tenderness.  Abdominal:     General: There is no distension.     Palpations: Abdomen is soft.     Tenderness: There is no abdominal  tenderness.  Musculoskeletal:        General: No swelling.     Right lower leg: No edema.     Left lower leg: No edema.     Comments: Chronically atrophied right upper extremity  Skin:    General: Skin is warm and dry.     Capillary Refill: Capillary refill takes less than 2 seconds.     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     Mental Status: He is alert. He is disoriented.  Psychiatric:        Mood and Affect: Mood normal. Affect is flat and angry.        Speech: Speech is slurred.        Behavior: Behavior is uncooperative and slowed.    ED Results / Procedures / Treatments   Labs (all labs ordered are listed, but only abnormal results are displayed) Labs Reviewed  COMPREHENSIVE METABOLIC PANEL -  Abnormal; Notable for the following components:      Result Value   Glucose, Bld 116 (*)    Creatinine, Ser 0.49 (*)    Total Protein 8.7 (*)    AST 132 (*)    ALT 91 (*)    Alkaline Phosphatase 137 (*)    All other components within normal limits  ETHANOL - Abnormal; Notable for the following components:   Alcohol, Ethyl (B) 478 (*)    All other components within normal limits  URINALYSIS, ROUTINE W REFLEX MICROSCOPIC - Abnormal; Notable for the following components:   Color, Urine COLORLESS (*)    Specific Gravity, Urine 1.001 (*)    All other components within normal limits  I-STAT CHEM 8, ED - Abnormal; Notable for the following components:   BUN 4 (*)    Glucose, Bld 116 (*)    Calcium, Ion 0.96 (*)    All other components within normal limits  CBC  PROTIME-INR  RAPID URINE DRUG SCREEN, HOSP PERFORMED    EKG EKG Interpretation  Date/Time:  Monday Jan 01 2022 10:45:53 EDT Ventricular Rate:  111 PR Interval:  151 QRS Duration: 81 QT Interval:  333 QTC Calculation: 453 R Axis:   20 Text Interpretation: Sinus tachycardia Confirmed by Gloris Manchester ([id number]) on 01/01/2022 11:11:59 AM  Radiology CT HEAD WO CONTRAST  Result Date: 01/01/2022 CLINICAL DATA:  Head trauma, abnormal mental status (Age 46-64y); Neck trauma, intoxicated or obtunded (Age >= 16y) EXAM: CT HEAD WITHOUT CONTRAST CT CERVICAL SPINE WITHOUT CONTRAST TECHNIQUE: Multidetector CT imaging of the head and cervical spine was performed following the standard protocol without intravenous contrast. Multiplanar CT image reconstructions of the cervical spine were also generated. RADIATION DOSE REDUCTION: This exam was performed according to the departmental dose-optimization program which includes automated exposure control, adjustment of the mA and/or kV according to patient size and/or use of iterative reconstruction technique. COMPARISON:  None Available. FINDINGS: CT HEAD FINDINGS Brain: No evidence of acute infarction,  hemorrhage, hydrocephalus, extra-axial collection or mass lesion/mass effect. Vascular: No hyperdense vessel identified. Skull: No acute fracture.  High posterior scalp contusion. Sinuses/Orbits: Mild paranasal sinus mucosal thickening. No acute orbital findings. Other: No mastoid effusions. CT CERVICAL SPINE FINDINGS Alignment: No substantial sagittal subluxation. Skull base and vertebrae: Vertebral body heights are maintained. No evidence of acute fracture. Soft tissues and spinal canal: No prevertebral fluid or swelling. No visible canal hematoma. Disc levels:  No significant focal bony degenerative  change. Upper chest: Visualized lung apices are clear. IMPRESSION: 1. No evidence of acute intracranial abnormality. 2. High posterior scalp contusion without acute calvarial fracture. 3. No evidence of acute fracture or traumatic malalignment in the cervical spine. Electronically Signed   By: Feliberto Harts M.D.   On: 01/01/2022 11:56   CT CERVICAL SPINE WO CONTRAST  Result Date: 01/01/2022 CLINICAL DATA:  Head trauma, abnormal mental status (Age 61-64y); Neck trauma, intoxicated or obtunded (Age >= 16y) EXAM: CT HEAD WITHOUT CONTRAST CT CERVICAL SPINE WITHOUT CONTRAST TECHNIQUE: Multidetector CT imaging of the head and cervical spine was performed following the standard protocol without intravenous contrast. Multiplanar CT image reconstructions of the cervical spine were also generated. RADIATION DOSE REDUCTION: This exam was performed according to the departmental dose-optimization program which includes automated exposure control, adjustment of the mA and/or kV according to patient size and/or use of iterative reconstruction technique. COMPARISON:  None Available. FINDINGS: CT HEAD FINDINGS Brain: No evidence of acute infarction, hemorrhage, hydrocephalus, extra-axial collection or mass lesion/mass effect. Vascular: No hyperdense vessel identified. Skull: No acute fracture.  High posterior scalp contusion.  Sinuses/Orbits: Mild paranasal sinus mucosal thickening. No acute orbital findings. Other: No mastoid effusions. CT CERVICAL SPINE FINDINGS Alignment: No substantial sagittal subluxation. Skull base and vertebrae: Vertebral body heights are maintained. No evidence of acute fracture. Soft tissues and spinal canal: No prevertebral fluid or swelling. No visible canal hematoma. Disc levels:  No significant focal bony degenerative change. Upper chest: Visualized lung apices are clear. IMPRESSION: 1. No evidence of acute intracranial abnormality. 2. High posterior scalp contusion without acute calvarial fracture. 3. No evidence of acute fracture or traumatic malalignment in the cervical spine. Electronically Signed   By: Feliberto Harts M.D.   On: 01/01/2022 11:56   DG Pelvis Portable  Result Date: 01/01/2022 CLINICAL DATA:  Patient presented with laceration to back of head. EXAM: PORTABLE PELVIS 1-2 VIEWS COMPARISON:  None Available. FINDINGS: There is no evidence of pelvic fracture or diastasis. No pelvic bone lesions are seen. IMPRESSION: Negative. Electronically Signed   By: Gerome Sam III M.D.   On: 01/01/2022 11:07   DG Chest Port 1 View  Result Date: 01/01/2022 CLINICAL DATA:  Laceration to back of head. EXAM: PORTABLE CHEST 1 VIEW COMPARISON:  None Available. FINDINGS: The cardiomediastinal silhouette is normal. No pneumothorax. No nodules or masses. No focal infiltrates. The right clavicle is displaced superior to the acromion. No other bony abnormalities are identified. IMPRESSION: 1. The right clavicle is displaced superior to the right acromion. This is consistent with an AC joint injury but is age indeterminate. Recommend clinical correlation. 2. No acute abnormalities otherwise identified in the lungs or chest. Electronically Signed   By: Gerome Sam III M.D.   On: 01/01/2022 11:06    Procedures Procedures    Medications Ordered in ED Medications  Tdap (BOOSTRIX) injection 0.5 mL  (0.5 mLs Intramuscular Given 01/01/22 1049)  lactated ringers bolus 1,000 mL (0 mLs Intravenous Stopped 01/01/22 1200)  thiamine (B-1) injection 100 mg (100 mg Intravenous Given 01/01/22 1334)    ED Course/ Medical Decision Making/ A&P                           Medical Decision Making Amount and/or Complexity of Data Reviewed Labs: ordered. Radiology: ordered. ECG/medicine tests: ordered.  Risk Prescription drug management.   This patient presents to the ED for concern of altered mental status, this involves an extensive  number of treatment options, and is a complaint that carries with it a high risk of complications and morbidity.  The differential diagnosis includes alcohol intoxication, polysubstance abuse, head trauma   Co morbidities that complicate the patient evaluation  Alcohol abuse   Additional history obtained:  Additional history obtained from EMS External records from outside source obtained and reviewed including EMR   Lab Tests:  I Ordered, and personally interpreted labs.  The pertinent results include: Normal hemoglobin, no leukocytosis, normal electrolytes, elevated transaminases with AST > ALT consistent with chronic alcohol abuse; UDS was negative, alcohol was markedly elevated at 478   Imaging Studies ordered:  I ordered imaging studies including CT head, CT cervical spine, chest and pelvis x-ray I independently visualized and interpreted imaging which showed posterior scalp contusion with no other evidence of acute injuries I agree with the radiologist interpretation   Cardiac Monitoring: / EKG:  The patient was maintained on a cardiac monitor.  I personally viewed and interpreted the cardiac monitored which showed an underlying rhythm of: Sinus rhythm   Problem List / ED Course / Critical interventions / Medication management  Patient is a 31 year old male with history of alcohol abuse, presenting for altered mental status and suspected fall.   History by EMS is very limited.  Patient was reportedly laying on the ground.  There was some evidence of fresh bleeding from the back of his head.  On scene, patient reportedly became belligerent with telephone interpreter.  He did not provide any information.  Acquaintance on scene was also unable/unwilling to provide history.  Patient was placed in cervical collar and transported to the ED.  On arrival in the ED, he is awake.  He does appear intoxicated.  He has what appears to be an abrasion to the posterior scalp with no other evidence of acute injuries.  His right arm is atrophied and cool to the touch which I suspect is a chronic condition.  [employer] interpreter was utilized but patient continued to be uncooperative in providing any history.  He would make odd, philosophical statements about his life as a whole.  Given suspicion of intoxication, patient was ordered bolus of IV fluids.  Laboratory work-up and imaging studies were ordered.  Tetanus was updated.  Patient's lab results are largely remarkable only for a ethanol level of 478.  Patient was kept on bedside cardiac monitor.  He did have brief decrease in his SPO2 which appears to be secondary to sleep apnea.  Hypoxia resolved with supplemental oxygen.  Patient to remain in the ED until clinically sober.  He will require reassessment at that time.  Care of patient was signed out to oncoming ED provider. I ordered medication including IV fluids for intoxication; Tdap for tetanus prophylaxis; thiamine for chronic alcoholism Reevaluation of the patient after these medicines showed that the patient stayed the same I have reviewed the patients home medicines and have made adjustments as needed   Social Determinants of Health:  History of alcohol abuse         Final Clinical Impression(s) / ED Diagnoses Final diagnoses:  Alcohol intoxication with delirium (HCC)  Injury of head, initial encounter    Rx / DC Orders ED Discharge Orders      None         Gloris Manchesterixon, [place] Downs, MD 01/01/22 1707

## 2022-01-01 NOTE — ED Notes (Signed)
Pt given cup of water. Pt tolerated well.

## 2022-01-01 NOTE — ED Triage Notes (Signed)
Pt bib ems for etoh and laceration to back of head. Per bystanders on scene per ems pt has been on a 2 day etoh binger.  Unknown if pt had a fall.  Pt arrived to ED w/ c-collar in place.  Interpreter used for triage.

## 2022-01-22 ENCOUNTER — Emergency Department (HOSPITAL_COMMUNITY)
Admission: EM | Admit: 2022-01-22 | Discharge: 2022-01-22 | Disposition: A | Discharge status: Home / Self Care | Payer: Self-pay | Attending: Emergency Medicine | Admitting: Emergency Medicine

## 2022-01-22 ENCOUNTER — Encounter (HOSPITAL_COMMUNITY): Payer: Self-pay | Admitting: Emergency Medicine

## 2022-01-22 DIAGNOSIS — R000 Tachycardia, unspecified: Secondary | ICD-10-CM | POA: Insufficient documentation

## 2022-01-22 DIAGNOSIS — F10920 Alcohol use, unspecified with intoxication, uncomplicated: Secondary | ICD-10-CM

## 2022-01-22 DIAGNOSIS — Y908 Blood alcohol level of 240 mg/100 ml or more: Secondary | ICD-10-CM | POA: Insufficient documentation

## 2022-01-22 DIAGNOSIS — F10129 Alcohol abuse with intoxication, unspecified: Secondary | ICD-10-CM | POA: Insufficient documentation

## 2022-01-22 DIAGNOSIS — Z79899 Other long term (current) drug therapy: Secondary | ICD-10-CM | POA: Insufficient documentation

## 2022-01-22 HISTORY — DX: Cerebral infarction, unspecified: I63.9

## 2022-01-22 LAB — COMPREHENSIVE METABOLIC PANEL
ALT: 238 U/L — ABNORMAL HIGH (ref 0–44)
AST: 336 U/L — ABNORMAL HIGH (ref 15–41)
Albumin: 4.6 g/dL (ref 3.5–5.0)
Alkaline Phosphatase: 142 U/L — ABNORMAL HIGH (ref 38–126)
Anion gap: 10 (ref 5–15)
BUN: 5 mg/dL — ABNORMAL LOW (ref 6–20)
CO2: 21 mmol/L — ABNORMAL LOW (ref 22–32)
Calcium: 8.7 mg/dL — ABNORMAL LOW (ref 8.9–10.3)
Chloride: 114 mmol/L — ABNORMAL HIGH (ref 98–111)
Creatinine, Ser: 0.46 mg/dL — ABNORMAL LOW (ref 0.61–1.24)
GFR, Estimated: >60 mL/min (ref >60)
Glucose, Bld: 109 mg/dL — ABNORMAL HIGH (ref 70–99)
Potassium: 3.3 mmol/L — ABNORMAL LOW (ref 3.5–5.1)
Sodium: 145 mmol/L (ref 135–145)
Total Bilirubin: 0.6 mg/dL (ref 0.3–1.2)
Total Protein: 8.5 g/dL — ABNORMAL HIGH (ref 6.5–8.1)

## 2022-01-22 LAB — RAPID URINE DRUG SCREEN, HOSP PERFORMED
Amphetamines: NOT DETECTED
Barbiturates: NOT DETECTED
Benzodiazepines: NOT DETECTED
Cocaine: NOT DETECTED
Opiates: NOT DETECTED
Tetrahydrocannabinol: NOT DETECTED

## 2022-01-22 LAB — CBC
HCT: 45 % (ref 39.0–52.0)
Hemoglobin: 15.3 g/dL (ref 13.0–17.0)
MCH: 33.3 pg (ref 26.0–34.0)
MCHC: 34 g/dL (ref 30.0–36.0)
MCV: 97.8 fL (ref 80.0–100.0)
Platelets: 117 10*3/uL — ABNORMAL LOW (ref 150–400)
RBC: 4.6 MIL/uL (ref 4.22–5.81)
RDW: 12.5 % (ref 11.5–15.5)
WBC: 6.1 10*3/uL (ref 4.0–10.5)
nRBC: 0 % (ref 0.0–0.2)

## 2022-01-22 LAB — ETHANOL: Alcohol, Ethyl (B): 472 mg/dL (ref <10)

## 2022-01-22 MED ORDER — SODIUM CHLORIDE 0.9 % IV BOLUS
1000.0000 mL | Freq: Once | INTRAVENOUS | Status: AC
  Administered 2022-01-22: 1000 mL via INTRAVENOUS

## 2022-01-22 MED ORDER — ONDANSETRON HCL 4 MG/2ML IJ SOLN
4.0000 mg | Freq: Once | INTRAMUSCULAR | Status: DC
  Filled 2022-01-22: qty 2

## 2022-01-22 NOTE — ED Provider Notes (Addendum)
[facility]   CSN: [id number] Arrival date & time: 01/22/22  1900     History  Chief Complaint  Patient presents with   Alcohol Intoxication    Jose Yu is a 31 y.o. male.  HPI  31 year old male with past medical history of alcohol abuse presents to the emergency department after being found laying in the grass on the side of the road.  Admitted EtOH on board.  CBG was appropriate with EMS.  History is limited.  He is Spanish-speaking, but even with the interpreter he does not fully cooperate.  Appears intoxicated, smells strongly of alcohol.  Denies any other drug use.  Is moving all 4 extremities and denies any acute complaints.  Home Medications Prior to Admission medications   Medication Sig Start Date End Date Taking? Authorizing Provider  HYDROcodone-acetaminophen (NORCO/VICODIN) 5-325 MG tablet Take 1 tablet by mouth every 6 (six) hours as needed. 08/02/17   Cristina Gong, PA-C      Allergies    Patient has no known allergies.    Review of Systems   Review of Systems  Reason unable to perform ROS: intoxication.    Physical Exam Updated Vital Signs BP (!) 164/84   Pulse (!) 110   Temp 98 F (36.7 C) (Oral)   Resp 17   SpO2 93%  Physical Exam Vitals and nursing note reviewed.  Constitutional:      General: He is not in acute distress.    Appearance: He is not diaphoretic.     Comments: Appears intoxicated  HENT:     Head: Normocephalic.     Mouth/Throat:     Mouth: Mucous membranes are moist.  Eyes:     Pupils: Pupils are equal, round, and reactive to light.  Cardiovascular:     Rate and Rhythm: Tachycardia present.  Pulmonary:     Effort: Pulmonary effort is normal. No respiratory distress.     Breath sounds: No wheezing or rales.  Abdominal:     Palpations: Abdomen is soft.     Tenderness: There is no abdominal tenderness.  Musculoskeletal:        General: No deformity.  Skin:     General: Skin is warm.     Findings: No bruising or rash.  Neurological:     Mental Status: He is alert and oriented to person, place, and time. Mental status is at baseline.  Psychiatric:        Mood and Affect: Mood normal.     ED Results / Procedures / Treatments   Labs (all labs ordered are listed, but only abnormal results are displayed) Labs Reviewed  COMPREHENSIVE METABOLIC PANEL - Abnormal; Notable for the following components:      Result Value   Potassium 3.3 (*)    Chloride 114 (*)    CO2 21 (*)    Glucose, Bld 109 (*)    BUN 5 (*)    Creatinine, Ser 0.46 (*)    Calcium 8.7 (*)    Total Protein 8.5 (*)    AST 336 (*)    ALT 238 (*)    Alkaline Phosphatase 142 (*)    All other components within normal limits  ETHANOL - Abnormal; Notable for the following components:   Alcohol, Ethyl (B) 472 (*)    All other components within normal limits  CBC - Abnormal; Notable for the following components:   Platelets 117 (*)    All other components within normal limits  RAPID URINE DRUG SCREEN, HOSP PERFORMED    EKG None  Radiology No results found.  Procedures Procedures    Medications Ordered in ED Medications - No data to display  ED Course/ Medical Decision Making/ A&P                           Medical Decision Making Amount and/or Complexity of Data Reviewed Labs: ordered.  Risk Prescription drug management.   Patient's work-up is significant for ethanol level over 400, this has been consistent with his previous visits.  Transaminitis is chronic. Patient offers no complaints.  Was tachycardic on arrival but otherwise stable appearing.  Patient has been allowed to metabolize, given IV fluids.  He is now ambulatory, clear speech, eating and drinking. No complaints, SI/HI. Seems appropriate, cooperative.  We will plan for outpatient follow-up.  Patient at this time appears safe and stable for discharge and close outpatient follow up. Discharge plan and  strict return to ED precautions discussed, patient verbalizes understanding and agreement.    Final Clinical Impression(s) / ED Diagnoses Final diagnoses:  None    Rx / DC Orders ED Discharge Orders     None         Rozelle Logan, DO 01/22/22 2321    Rozelle Logan, DO 01/22/22 2321

## 2022-01-22 NOTE — ED Notes (Signed)
Pt stand up independently  and used the bathroom

## 2022-01-22 NOTE — ED Triage Notes (Signed)
The patient was found laying in the grass on the side of the rode. ETOH onboard.    Hx Stroke    EMS vitals: 152/90 BP 96 HR 113 CBG 95% SPO2 on room air

## 2022-01-24 ENCOUNTER — Emergency Department (HOSPITAL_COMMUNITY)
Admission: EM | Admit: 2022-01-24 | Discharge: 2022-01-24 | Disposition: A | Discharge status: Home / Self Care | Payer: Self-pay | Attending: Emergency Medicine | Admitting: Emergency Medicine

## 2022-01-24 ENCOUNTER — Other Ambulatory Visit: Payer: Self-pay

## 2022-01-24 ENCOUNTER — Encounter (HOSPITAL_COMMUNITY): Payer: Self-pay

## 2022-01-24 DIAGNOSIS — F10920 Alcohol use, unspecified with intoxication, uncomplicated: Secondary | ICD-10-CM

## 2022-01-24 DIAGNOSIS — R531 Weakness: Secondary | ICD-10-CM | POA: Insufficient documentation

## 2022-01-24 DIAGNOSIS — R21 Rash and other nonspecific skin eruption: Secondary | ICD-10-CM | POA: Insufficient documentation

## 2022-01-24 DIAGNOSIS — F10120 Alcohol abuse with intoxication, uncomplicated: Secondary | ICD-10-CM | POA: Insufficient documentation

## 2022-01-24 LAB — RAPID HIV SCREEN (HIV 1/2 AB+AG)
HIV 1/2 Antibodies: NONREACTIVE
HIV-1 P24 Antigen - HIV24: NONREACTIVE

## 2022-01-24 LAB — CBG MONITORING, ED: Glucose-Capillary: 115 mg/dL — ABNORMAL HIGH (ref 70–99)

## 2022-01-24 NOTE — ED Provider Notes (Addendum)
[facility]-EMERGENCY DEPT Provider Note   CSN: [id number] Arrival date & time: 01/24/22  1136     History  Chief Complaint  Patient presents with   Alcohol Intoxication    Jose Yu is a 31 y.o. male.  Patient with longstanding history of alcohol abuse brought in by ambulance today for alcohol intoxication.  History combined from EMS as well as patient, using Spanish video interpreter.  Patient states that he has been feeling very weak.  He states that he drank "2" beers this morning.  He states that he would have drank 6, however he was weak.  He denies any pain "except in my soul".  I asked the patient if he passed out today.  He states that he was weak and was tired so he fell asleep.  He thinks the ambulance was called because someone "thought I was dead".  Patient denies any acute complaints other than ongoing scaling rash on scalp and over body.  Patient has multiple ovoid type lesions.      Home Medications Prior to Admission medications   Medication Sig Start Date End Date Taking? Authorizing Provider  HYDROcodone-acetaminophen (NORCO/VICODIN) 5-325 MG tablet Take 1 tablet by mouth every 6 (six) hours as needed. 08/02/17   Cristina Gong, PA-C      Allergies    Patient has no known allergies.    Review of Systems   Review of Systems  Physical Exam Updated Vital Signs BP (!) 143/112 (BP Location: Left Arm)   Pulse 80   Temp 97.8 F (36.6 C) (Oral)   SpO2 96%  Physical Exam Vitals and nursing note reviewed.  Constitutional:      General: He is not in acute distress.    Appearance: He is well-developed.  HENT:     Head: Normocephalic and atraumatic.  Eyes:     General:        Right eye: No discharge.        Left eye: No discharge.     Conjunctiva/sclera: Conjunctivae normal.  Cardiovascular:     Rate and Rhythm: Normal rate and regular rhythm.     Heart sounds: Normal heart sounds.  Pulmonary:     Effort: Pulmonary  effort is normal.     Breath sounds: Normal breath sounds.     Comments: No respiratory distress Abdominal:     Palpations: Abdomen is soft.     Tenderness: There is no abdominal tenderness.  Musculoskeletal:     Cervical back: Normal range of motion and neck supple.     Comments: History of brachial plexus injury, atrophy of right arm and patient does not use this arm to gesticulate.  Skin:    General: Skin is warm and dry.     Comments: Patient with scaly rash of the scalp, no abscess or signs of secondary cellulitis.  Also multiple ovoid raised, psoriatic appearing lesions over the trunk and back.  Neurological:     Mental Status: He is alert.    ED Results / Procedures / Treatments   Labs (all labs ordered are listed, but only abnormal results are displayed) Labs Reviewed  CBG MONITORING, ED    ED ECG REPORT   Date: 01/24/2022  Rate: 89  Rhythm: normal sinus rhythm  QRS Axis: normal  Intervals: PR shortened  ST/T Wave abnormalities: normal  Conduction Disutrbances:none  Narrative Interpretation:   Old EKG Reviewed: unchanged except slower from 01/01/22  I have personally reviewed the EKG tracing and agree with  the computerized printout as noted.   Radiology No results found.  Procedures Procedures    Medications Ordered in ED Medications - No data to display  ED Course/ Medical Decision Making/ A&P    Patient seen and examined. History obtained directly from patient.  I also reviewed previous ED notes.  Patient was most recently seen 2 days ago.  He is able to contribute to history today using Spanish video interpreter.  Given rash, although appears psoriatic, will send HIV and RPR as I do not see patient has been checked for this in the past.  Do not feel that he need requires other routine labs.  Will discharge when safe.  Labs/EKG: Ordered blood sugar, HIV, RPR.  EKG also due to possible syncopal episode.  Imaging: None ordered.  Considered CT imaging of  the head, however patient does not have any signs of head trauma or reported head trauma.  Medications/Fluids: None ordered  Most recent vital signs reviewed and are as follows: BP (!) 143/112 (BP Location: Left Arm)   Pulse 80   Temp 97.8 F (36.6 C) (Oral)   SpO2 96%   Initial impression: Alcohol intoxication, chronic rash  1:43 PM Reassessment performed. Patient appears stable.  He is standing up in the room, putting on a sweatshirt without any difficulty.  Labs personally reviewed and interpreted including: Rapid HIV negative, RPR pending  Plan: Discharge.  ED return instructions discussed: Worsening symptoms or other concerns  Follow-up instructions discussed: Patient encouraged to follow-up with substance use abuse referrals.                          Medical Decision Making Amount and/or Complexity of Data Reviewed Labs: ordered.   Patient with chronic alcohol use, occasional ED visits for intoxication.  Patient appears intoxicated today.  There is a question as to whether or not he had a syncopal episode.  EKG is without signs of heart block, WPW, Brugada syndrome, prolonged QT or other arrhythmia.  No concern for head or neck injury at this time.  Patient appropriate in the emergency department.  No signs of clinically significant alcohol withdrawal at this time.  He does have a rash with multiple scattered plaques and scaling of the scalp.  Most consistent with psoriasis.  I did send HIV and RPR on the patient today as this has not been done in the past.  I do not see any signs of cellulitis or abscess.  The patient's vital signs, pertinent lab work and imaging were reviewed and interpreted as discussed in the ED course. Hospitalization was considered for further testing, treatments, or serial exams/observation. However as patient is well-appearing, has a stable exam, and reassuring studies today, I do not feel that they warrant admission at this time. This plan was  discussed with the patient who verbalizes agreement and comfort with this plan and seems reliable and able to return to the Emergency Department with worsening or changing symptoms.    Final Clinical Impression(s) / ED Diagnoses Final diagnoses:  Alcoholic intoxication without complication [facility])  Rash    Rx / DC Orders ED Discharge Orders     None         Renne Crigler, PA-C 01/24/22 1345    Gwyneth Sprout, MD 01/24/22 1515

## 2022-01-24 NOTE — ED Triage Notes (Signed)
Pt. C/o dizzines and passing out from his alcohol intoxication. Pt. Mentions he drinks a 12-pack a day. Ems reported Pt. Has already drank 3 40oz beers this morning.

## 2022-01-25 LAB — RPR: RPR Ser Ql: NONREACTIVE

## 2022-03-13 ENCOUNTER — Other Ambulatory Visit: Payer: Self-pay

## 2022-03-13 ENCOUNTER — Emergency Department (HOSPITAL_COMMUNITY)

## 2022-03-13 ENCOUNTER — Inpatient Hospital Stay (HOSPITAL_COMMUNITY)
Admission: EM | Admit: 2022-03-13 | Discharge: 2022-03-17 | DRG: 872 | Attending: Internal Medicine | Admitting: Internal Medicine

## 2022-03-13 ENCOUNTER — Encounter (HOSPITAL_COMMUNITY): Payer: Self-pay

## 2022-03-13 DIAGNOSIS — Z8673 Personal history of transient ischemic attack (TIA), and cerebral infarction without residual deficits: Secondary | ICD-10-CM | POA: Diagnosis not present

## 2022-03-13 DIAGNOSIS — R21 Rash and other nonspecific skin eruption: Secondary | ICD-10-CM | POA: Diagnosis present

## 2022-03-13 DIAGNOSIS — L210 Seborrhea capitis: Secondary | ICD-10-CM | POA: Diagnosis present

## 2022-03-13 DIAGNOSIS — S1190XA Unspecified open wound of unspecified part of neck, initial encounter: Secondary | ICD-10-CM

## 2022-03-13 DIAGNOSIS — L0390 Cellulitis, unspecified: Secondary | ICD-10-CM | POA: Diagnosis present

## 2022-03-13 DIAGNOSIS — Z23 Encounter for immunization: Secondary | ICD-10-CM | POA: Diagnosis not present

## 2022-03-13 DIAGNOSIS — L219 Seborrheic dermatitis, unspecified: Secondary | ICD-10-CM | POA: Diagnosis present

## 2022-03-13 DIAGNOSIS — A419 Sepsis, unspecified organism: Principal | ICD-10-CM | POA: Diagnosis present

## 2022-03-13 DIAGNOSIS — G8194 Hemiplegia, unspecified affecting left nondominant side: Secondary | ICD-10-CM | POA: Diagnosis present

## 2022-03-13 DIAGNOSIS — L03221 Cellulitis of neck: Secondary | ICD-10-CM | POA: Diagnosis present

## 2022-03-13 DIAGNOSIS — E871 Hypo-osmolality and hyponatremia: Secondary | ICD-10-CM

## 2022-03-13 DIAGNOSIS — L409 Psoriasis, unspecified: Secondary | ICD-10-CM | POA: Diagnosis present

## 2022-03-13 DIAGNOSIS — Z22322 Carrier or suspected carrier of Methicillin resistant Staphylococcus aureus: Secondary | ICD-10-CM | POA: Diagnosis not present

## 2022-03-13 DIAGNOSIS — R6520 Severe sepsis without septic shock: Secondary | ICD-10-CM | POA: Diagnosis not present

## 2022-03-13 DIAGNOSIS — F1010 Alcohol abuse, uncomplicated: Secondary | ICD-10-CM | POA: Diagnosis present

## 2022-03-13 DIAGNOSIS — Z20822 Contact with and (suspected) exposure to covid-19: Secondary | ICD-10-CM | POA: Diagnosis present

## 2022-03-13 DIAGNOSIS — R7989 Other specified abnormal findings of blood chemistry: Secondary | ICD-10-CM | POA: Diagnosis not present

## 2022-03-13 DIAGNOSIS — M609 Myositis, unspecified: Secondary | ICD-10-CM | POA: Diagnosis present

## 2022-03-13 DIAGNOSIS — F1090 Alcohol use, unspecified, uncomplicated: Secondary | ICD-10-CM | POA: Diagnosis not present

## 2022-03-13 DIAGNOSIS — L0292 Furuncle, unspecified: Secondary | ICD-10-CM | POA: Diagnosis present

## 2022-03-13 LAB — CBC WITH DIFFERENTIAL/PLATELET
Abs Immature Granulocytes: 0.18 10*3/uL — ABNORMAL HIGH (ref 0.00–0.07)
Basophils Absolute: 0.1 10*3/uL (ref 0.0–0.1)
Basophils Relative: 0 %
Eosinophils Absolute: 0.6 10*3/uL — ABNORMAL HIGH (ref 0.0–0.5)
Eosinophils Relative: 3 %
HCT: 50.4 % (ref 39.0–52.0)
Hemoglobin: 17.4 g/dL — ABNORMAL HIGH (ref 13.0–17.0)
Immature Granulocytes: 1 %
Lymphocytes Relative: 8 %
Lymphs Abs: 1.7 10*3/uL (ref 0.7–4.0)
MCH: 32.8 pg (ref 26.0–34.0)
MCHC: 34.5 g/dL (ref 30.0–36.0)
MCV: 95.1 fL (ref 80.0–100.0)
Monocytes Absolute: 2.5 10*3/uL — ABNORMAL HIGH (ref 0.1–1.0)
Monocytes Relative: 11 %
Neutro Abs: 17.3 10*3/uL — ABNORMAL HIGH (ref 1.7–7.7)
Neutrophils Relative %: 77 %
Platelets: 189 10*3/uL (ref 150–400)
RBC: 5.3 MIL/uL (ref 4.22–5.81)
RDW: 11.8 % (ref 11.5–15.5)
WBC: 22.4 10*3/uL — ABNORMAL HIGH (ref 4.0–10.5)
nRBC: 0 % (ref 0.0–0.2)

## 2022-03-13 LAB — COMPREHENSIVE METABOLIC PANEL
ALT: 30 U/L (ref 0–44)
AST: 21 U/L (ref 15–41)
Albumin: 4.2 g/dL (ref 3.5–5.0)
Alkaline Phosphatase: 112 U/L (ref 38–126)
Anion gap: 11 (ref 5–15)
BUN: 7 mg/dL (ref 6–20)
CO2: 24 mmol/L (ref 22–32)
Calcium: 9.1 mg/dL (ref 8.9–10.3)
Chloride: 99 mmol/L (ref 98–111)
Creatinine, Ser: 0.63 mg/dL (ref 0.61–1.24)
GFR, Estimated: >60 mL/min (ref >60)
Glucose, Bld: 159 mg/dL — ABNORMAL HIGH (ref 70–99)
Potassium: 3.7 mmol/L (ref 3.5–5.1)
Sodium: 134 mmol/L — ABNORMAL LOW (ref 135–145)
Total Bilirubin: 0.8 mg/dL (ref 0.3–1.2)
Total Protein: 8.9 g/dL — ABNORMAL HIGH (ref 6.5–8.1)

## 2022-03-13 LAB — URINALYSIS, ROUTINE W REFLEX MICROSCOPIC
Bilirubin Urine: NEGATIVE
Glucose, UA: NEGATIVE mg/dL
Hgb urine dipstick: NEGATIVE
Ketones, ur: NEGATIVE mg/dL
Leukocytes,Ua: NEGATIVE
Nitrite: NEGATIVE
Protein, ur: NEGATIVE mg/dL
Specific Gravity, Urine: 1.035 — ABNORMAL HIGH (ref 1.005–1.030)
pH: 6 (ref 5.0–8.0)

## 2022-03-13 LAB — LACTIC ACID, PLASMA: Lactic Acid, Venous: 1.2 mmol/L (ref 0.5–1.9)

## 2022-03-13 LAB — RESP PANEL BY RT-PCR (FLU A&B, COVID) ARPGX2
Influenza A by PCR: NEGATIVE
Influenza B by PCR: NEGATIVE
SARS Coronavirus 2 by RT PCR: NEGATIVE

## 2022-03-13 LAB — PROTIME-INR
INR: 1.1 (ref 0.8–1.2)
Prothrombin Time: 14.1 seconds (ref 11.4–15.2)

## 2022-03-13 LAB — APTT: aPTT: 28 seconds (ref 24–36)

## 2022-03-13 MED ORDER — IOHEXOL 300 MG/ML  SOLN
75.0000 mL | Freq: Once | INTRAMUSCULAR | Status: AC | PRN
  Administered 2022-03-13: 75 mL via INTRAVENOUS

## 2022-03-13 MED ORDER — LACTATED RINGERS IV BOLUS (SEPSIS)
1000.0000 mL | Freq: Once | INTRAVENOUS | Status: AC
  Administered 2022-03-13: 1000 mL via INTRAVENOUS

## 2022-03-13 MED ORDER — SODIUM CHLORIDE 0.9 % IV SOLN: 2.0000 g | INTRAVENOUS | Status: DC

## 2022-03-13 MED ORDER — VANCOMYCIN HCL IN DEXTROSE 1-5 GM/200ML-% IV SOLN: 1000.0000 mg | Freq: Once | INTRAVENOUS | Status: DC

## 2022-03-13 MED ORDER — VANCOMYCIN HCL 1500 MG/300ML IV SOLN
1500.0000 mg | Freq: Once | INTRAVENOUS | Status: AC
Start: 2022-03-13 — End: 2022-03-14
  Administered 2022-03-13: 1500 mg via INTRAVENOUS
  Filled 2022-03-13: qty 300

## 2022-03-13 MED ORDER — VANCOMYCIN HCL 1250 MG/250ML IV SOLN
1250.0000 mg | Freq: Two times a day (BID) | INTRAVENOUS | Status: DC
  Administered 2022-03-14 – 2022-03-16 (×5): 1250 mg via INTRAVENOUS
  Filled 2022-03-13 (×5): qty 250

## 2022-03-13 MED ORDER — CEFTRIAXONE SODIUM 2 G IJ SOLR
2.0000 g | Freq: Once | INTRAMUSCULAR | Status: AC
Start: 2022-03-13 — End: 2022-03-13
  Administered 2022-03-13: 2 g via INTRAVENOUS
  Filled 2022-03-13: qty 20

## 2022-03-13 MED ORDER — LACTATED RINGERS IV SOLN: INTRAVENOUS | Status: DC

## 2022-03-13 MED ORDER — ACETAMINOPHEN 325 MG PO TABS
650.0000 mg | ORAL_TABLET | Freq: Once | ORAL | Status: AC
  Administered 2022-03-13: 650 mg via ORAL
  Filled 2022-03-13: qty 2

## 2022-03-13 MED ORDER — SODIUM CHLORIDE 0.9 % IV BOLUS: 1000.0000 mL | Freq: Once | INTRAVENOUS | Status: DC

## 2022-03-13 NOTE — ED Provider Triage Note (Cosign Needed Addendum)
Emergency Medicine Provider Triage Evaluation Note  Jose Yu , a 31 y.o. male  was evaluated in triage.  Pt complains of infection to back of his neck for 5 days.  Pt complains of fever Review of Systems  Positive: Fever Negative: vomiting  Physical Exam  BP 130/84 (BP Location: Left Arm)   Pulse (!) 124   Temp (!) 101 F (38.3 C) (Oral)   Resp 16   SpO2 96%  Gen:   Awake, uncomfortable    MSK:   Moves extremities without difficulty  Other:  Swollen area left neck  large draining area, decreased range of motion neck   Medical Decision Making  Medically screening exam initiated at 4:50 PM.  Appropriate orders placed.  Jose Yu was informed that the remainder of the evaluation will be completed by another provider, this initial triage assessment does not replace that evaluation, and the importance of remaining in the ED until their evaluation is complete.     [name], PA-C 03/13/22 1651    [name], [place] 03/13/22 1654

## 2022-03-13 NOTE — Assessment & Plan Note (Signed)
-  admit per cellulitis protocol will    continue current antibiotic choice rocephin, vanc      plain films showed:  no evidence of air  no evidence of osteomyelitis no foreign   [facility] was made aware Consult as needed at this time no indication for intervention      Will obtain MRSA screening,      obtain blood cultures if febrile or septic     further antibiotic adjustment pending above results
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
## 2022-03-13 NOTE — Progress Notes (Signed)
Pharmacy Antibiotic Note  Jose Yu is a 31 y.o. male admitted on 03/13/2022 with a significant wound to the back of the neck. CT soft tissue of the neck showed diffuse soft tissue swelling w/ inflammatory stranding consistent with acute infection/cellulitis with myositis.   Pharmacy has been consulted for vancomycin dosing.  Received vancomycin 1500mg  IV x1 8/8 at 1812  Plan: Vancomycin 1250mg  IV q12 hours (eAUC 513, Scr 0.8, Vd 0.72) Ceftriaxone 2g IV q24 hours per MD Monitor clinical improvement, renal function, ability to narrow antibiotics  Height: 5\' 4"  (162.6 cm) Weight: 70 kg (154 lb 5.2 oz) IBW/kg (Calculated) : 59.2  Temp (24hrs), Avg:100.7 F (38.2 C), Min:100.3 F (37.9 C), Max:101 F (38.3 C)  Recent Labs  Lab 03/13/22 1748  WBC 22.4*  CREATININE 0.63  LATICACIDVEN 1.2    Estimated Creatinine Clearance: 111 mL/min (by C-G formula based on SCr of 0.63 mg/dL).    No Known Allergies  Antimicrobials this admission: Ceftriaxone 8/8 >>  Vancomycin 8/8 >>   Dose adjustments this admission:  Microbiology results: 8/8 BCx:   Thank you for allowing pharmacy to be a part of this patient's care.  05/13/22, PharmD, BCPS 03/13/2022 9:05 PM

## 2022-03-13 NOTE — Assessment & Plan Note (Signed)
Diffuse patches Located on the torso round with raised border Could be potentially psoriasis but this only started about 3 months ago.  No prior history of this in the past.  leishmaniasis infection also comes to mind Consult ID may need to see dermatology as an outpatient

## 2022-03-13 NOTE — ED Triage Notes (Signed)
Patient has an abscess to the right upper neck in the hairline that is draining yellow drainage.Patient also has redness to the right upper back x 5 days.  Patient is with [facility] jail staff.

## 2022-03-13 NOTE — Assessment & Plan Note (Signed)
-  SIRS criteria met with elevated white blood cell count,       Component Value Date/Time   WBC 22.4 (H) 03/13/2022 1748   LYMPHSABS 1.7 03/13/2022 1748    tachycardia   ,  fever   RR >20 Today's Vitals   03/13/22 2030 03/13/22 2100 03/13/22 2102 03/13/22 2230  BP: 134/86 131/85  126/81  Pulse: (!) 117 (!) 115  (!) 123  Resp: (!) 30 (!) 24  14  Temp:   100.3 F (37.9 C)   TempSrc:   Oral   SpO2: 96% 96%  95%  Weight:      Height:      PainSc:          The recent clinical data is shown below. Vitals:   03/13/22 2030 03/13/22 2100 03/13/22 2102 03/13/22 2230  BP: 134/86 131/85  126/81  Pulse: (!) 117 (!) 115  (!) 123  Resp: (!) 30 (!) 24  14  Temp:   100.3 F (37.9 C)   TempSrc:   Oral   SpO2: 96% 96%  95%  Weight:      Height:       -Most likely source being ,  Cellulitis, soft tissue infection,        - Obtain serial lactic acid and procalcitonin level.  - Initiated IV antibiotics in ER: Antibiotics Given (last 72 hours)    Date/Time Action Medication Dose Rate   03/13/22 1812 New Bag/Given   vancomycin (VANCOREADY) IVPB 1500 mg/300 mL 1,500 mg 150 mL/hr   03/13/22 1829 New Bag/Given   cefTRIAXone (ROCEPHIN) 2 g in sodium chloride 0.9 % 100 mL IVPB 2 g 200 mL/hr      Will continue  on : Rocephin vancomycin   - await results of blood and urine culture  - Rehydrate aggressively  Intravenous fluids were administered   30cc/kg fluid     10:53 PM

## 2022-03-13 NOTE — Subjective & Objective (Signed)
  He is curretnly at jail  Carbuncule in the back on his neck fever and tachycardia  Initially tachy up to 120 With significant wound to the back of his neck

## 2022-03-13 NOTE — Progress Notes (Signed)
A consult was received from an ED physician for vancomycin per pharmacy dosing.  The patient's profile has been reviewed for ht/wt/allergies/indication/available labs.   A one time order has been placed for vancomycin 1500mg  IV x1.    Further antibiotics/pharmacy consults should be ordered by admitting physician if indicated.                       Thank you,  , PharmD, BCPS 03/13/2022 5:15 PM
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
## 2022-03-13 NOTE — Sepsis Progress Note (Signed)
Elink following code sepsis °

## 2022-03-14 DIAGNOSIS — L03221 Cellulitis of neck: Secondary | ICD-10-CM | POA: Diagnosis not present

## 2022-03-14 DIAGNOSIS — R21 Rash and other nonspecific skin eruption: Secondary | ICD-10-CM

## 2022-03-14 DIAGNOSIS — E871 Hypo-osmolality and hyponatremia: Secondary | ICD-10-CM

## 2022-03-14 DIAGNOSIS — F1090 Alcohol use, unspecified, uncomplicated: Secondary | ICD-10-CM

## 2022-03-14 DIAGNOSIS — R7989 Other specified abnormal findings of blood chemistry: Secondary | ICD-10-CM

## 2022-03-14 DIAGNOSIS — A419 Sepsis, unspecified organism: Secondary | ICD-10-CM | POA: Diagnosis not present

## 2022-03-14 LAB — COMPREHENSIVE METABOLIC PANEL
ALT: 24 U/L (ref 0–44)
AST: 18 U/L (ref 15–41)
Albumin: 3.5 g/dL (ref 3.5–5.0)
Alkaline Phosphatase: 93 U/L (ref 38–126)
Anion gap: 11 (ref 5–15)
BUN: 7 mg/dL (ref 6–20)
CO2: 24 mmol/L (ref 22–32)
Calcium: 8.9 mg/dL (ref 8.9–10.3)
Chloride: 99 mmol/L (ref 98–111)
Creatinine, Ser: 0.6 mg/dL — ABNORMAL LOW (ref 0.61–1.24)
GFR, Estimated: >60 mL/min (ref >60)
Glucose, Bld: 110 mg/dL — ABNORMAL HIGH (ref 70–99)
Potassium: 3.9 mmol/L (ref 3.5–5.1)
Sodium: 134 mmol/L — ABNORMAL LOW (ref 135–145)
Total Bilirubin: 0.9 mg/dL (ref 0.3–1.2)
Total Protein: 7.6 g/dL (ref 6.5–8.1)

## 2022-03-14 LAB — HEPATITIS B SURFACE ANTIGEN: Hepatitis B Surface Ag: NONREACTIVE

## 2022-03-14 LAB — CBC
HCT: 45.8 % (ref 39.0–52.0)
Hemoglobin: 15.7 g/dL (ref 13.0–17.0)
MCH: 32.4 pg (ref 26.0–34.0)
MCHC: 34.3 g/dL (ref 30.0–36.0)
MCV: 94.4 fL (ref 80.0–100.0)
Platelets: 180 10*3/uL (ref 150–400)
RBC: 4.85 MIL/uL (ref 4.22–5.81)
RDW: 11.9 % (ref 11.5–15.5)
WBC: 20.5 10*3/uL — ABNORMAL HIGH (ref 4.0–10.5)
nRBC: 0 % (ref 0.0–0.2)

## 2022-03-14 LAB — MRSA NEXT GEN BY PCR, NASAL: MRSA by PCR Next Gen: DETECTED — AB

## 2022-03-14 LAB — HEPATITIS C ANTIBODY: HCV Ab: NONREACTIVE

## 2022-03-14 LAB — PROCALCITONIN: Procalcitonin: <0.1 ng/mL

## 2022-03-14 LAB — MAGNESIUM: Magnesium: 1.9 mg/dL (ref 1.7–2.4)

## 2022-03-14 LAB — HEPATITIS B CORE ANTIBODY, TOTAL: Hep B Core Total Ab: NONREACTIVE

## 2022-03-14 LAB — HIV ANTIBODY (ROUTINE TESTING W REFLEX): HIV Screen 4th Generation wRfx: NONREACTIVE

## 2022-03-14 LAB — PHOSPHORUS: Phosphorus: 4.3 mg/dL (ref 2.5–4.6)

## 2022-03-14 MED ORDER — LACTATED RINGERS IV SOLN: INTRAVENOUS | Status: AC

## 2022-03-14 MED ORDER — HYDROCODONE-ACETAMINOPHEN 5-325 MG PO TABS
1.0000 | ORAL_TABLET | ORAL | Status: DC | PRN
  Administered 2022-03-14 (×2): 1 via ORAL
  Administered 2022-03-14: 2 via ORAL
  Administered 2022-03-15: 1 via ORAL
  Filled 2022-03-14: qty 1
  Filled 2022-03-14: qty 2
  Filled 2022-03-14 (×2): qty 1

## 2022-03-14 MED ORDER — LACTATED RINGERS IV BOLUS
1000.0000 mL | Freq: Once | INTRAVENOUS | Status: AC
  Administered 2022-03-14: 1000 mL via INTRAVENOUS

## 2022-03-14 MED ORDER — ACETAMINOPHEN 650 MG RE SUPP: 650.0000 mg | Freq: Four times a day (QID) | RECTAL | Status: DC | PRN

## 2022-03-14 MED ORDER — ACETAMINOPHEN 325 MG PO TABS
650.0000 mg | ORAL_TABLET | Freq: Four times a day (QID) | ORAL | Status: AC | PRN
  Administered 2022-03-14 – 2022-03-16 (×2): 650 mg via ORAL
  Filled 2022-03-14 (×2): qty 2

## 2022-03-14 MED ORDER — ENSURE ENLIVE PO LIQD
237.0000 mL | Freq: Two times a day (BID) | ORAL | Status: DC
  Administered 2022-03-14 – 2022-03-17 (×6): 237 mL via ORAL

## 2022-03-14 MED ORDER — ACETAMINOPHEN 325 MG PO TABS
650.0000 mg | ORAL_TABLET | Freq: Four times a day (QID) | ORAL | Status: DC | PRN
  Filled 2022-03-14: qty 2

## 2022-03-14 MED ORDER — HYDROCORTISONE 1 % EX CREA: TOPICAL_CREAM | CUTANEOUS | Status: DC | PRN

## 2022-03-14 MED ORDER — SODIUM CHLORIDE 0.9 % IV SOLN: INTRAVENOUS | Status: DC

## 2022-03-14 MED ORDER — MUPIROCIN 2 % EX OINT
1.0000 | TOPICAL_OINTMENT | Freq: Two times a day (BID) | CUTANEOUS | Status: DC
  Administered 2022-03-14 – 2022-03-17 (×6): 1 via NASAL
  Filled 2022-03-14 (×2): qty 22

## 2022-03-14 MED ORDER — PNEUMOCOCCAL 20-VAL CONJ VACC 0.5 ML IM SUSY
0.5000 mL | PREFILLED_SYRINGE | INTRAMUSCULAR | Status: AC
  Administered 2022-03-15: 0.5 mL via INTRAMUSCULAR
  Filled 2022-03-14: qty 0.5

## 2022-03-14 MED ORDER — ENOXAPARIN SODIUM 40 MG/0.4ML IJ SOSY
40.0000 mg | PREFILLED_SYRINGE | INTRAMUSCULAR | Status: DC
  Administered 2022-03-14 – 2022-03-16 (×3): 40 mg via SUBCUTANEOUS
  Filled 2022-03-14 (×3): qty 0.4

## 2022-03-14 MED ORDER — CHLORHEXIDINE GLUCONATE CLOTH 2 % EX PADS
6.0000 | MEDICATED_PAD | Freq: Every day | CUTANEOUS | Status: DC
  Administered 2022-03-15 – 2022-03-17 (×3): 6 via TOPICAL

## 2022-03-14 NOTE — ED Notes (Addendum)
Pt ambulated to bathroom without assistant.

## 2022-03-14 NOTE — Progress Notes (Signed)
Interpreter services used. Sunday Shams [phone]
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
## 2022-03-14 NOTE — Progress Notes (Signed)
Chaplain met with [facility] to provide support.  He requested a prayer of gratitude for helping him get the medical care he needed.  He also wanted to pray for his family and their protection.  Chaplain provided prayer as well as emotional and spiritual support.  [address], [place] Pager, [phone]

## 2022-03-14 NOTE — Consult Note (Addendum)
[facility]    Date of Admission:  03/13/2022     Reason for Consult: Neck wound     Referring Physician: Dr Adela Glimpse  Current antibiotics: Vancomycin Ceftriaxone   ASSESSMENT:    31 y.o. male admitted with:  Infected right posterior neck wound: Patient presenting with sepsis secondary to this wound that initially developed as a furuncle that he subsequently scratched and became secondarily infected.  CT scan with contrast obtained on admission notable for diffuse soft tissue swelling with inflammatory stranding throughout the soft tissues of the right posterior lateral neck consistent with acute infection/cellulitis with myositis.  Fortunately there was no discrete abscess or drainable fluid collection noted.  Blood cultures are currently pending and he is on broad-spectrum antibiotics.  Admission H&P query the possibility of cutaneous leishmaniasis.  This is an unlikely explanation given the acute onset of the posterior neck wound and that patient has not been back to [place] since 2009 whereas the typical incubation period for cutaneous leishmaniasis occurs over several weeks.  Given the rapidity of onset and purulent drainage this also appears inconsistent with an atypical fungal or mycobacterial infection and more consistent with typical bacterial infection such as staph or strep species. Multiple cutaneous skin lesions and seborrheic dermatitis: These other lesions appear more chronic and not consistent with active infection.  He reports they have been present for about the past 6 months or so.  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 Sepsis: Due to #1. EtOH use disorder: Per primary team. History of elevated LFTs: Noted in the past.  Normalized this admission.  RECOMMENDATIONS:    Continue vancomycin per pharamacy Stop ceftriaxone Follow up blood cultures Wound care Topical steroid for his  other cutaneous lesions Will benefit from dermatology or rheumatology evaluation after discharge Will check HIV, RPR, and hepatitis serologies Will follow   Principal Problem:   Sepsis (HCC) Active Problems:   Rash   Cellulitis   MEDICATIONS:    Scheduled Meds: Continuous Infusions: . lactated ringers 125 mL/hr at 03/14/22 0942  . vancomycin Stopped (03/14/22 0835)   PRN Meds:.acetaminophen **OR** acetaminophen, acetaminophen, HYDROcodone-acetaminophen, hydrocortisone cream  HPI:    Jose Yu is a 31 y.o. male with a past medical history of alcohol use disorder, frequent emergency department visits for intoxication, and remote history of right brachial plexus injury who presented to [facility] overnight from jail where he has been incarcerated for the past 24 days due to sepsis secondary to a right posterior neck soft tissue wound.  Patient was reportedly diagnosed with a "carbuncle" while incarcerated.  This reportedly ruptured the day prior to admission and drained a lot of purulent and bloody material.  Patient was interviewed with the help of the Spanish language interpreter.  He reports that approximately 6 days ago he noticed a small itchy bump in this area which he subsequently scratched excessively.  This subsequently led to worsening pain and swelling with eventual purulent drainage.  Patient also has a history of excessive dandruff as well as chronic cutaneous lesions.  These were present at an ER visit in June at which time it was felt consistent with psoriatic lesion.  HIV and syphilis testing was negative at that time.  He reports that these lesions can be quite pruritic.  He states that he occasionally uses hydrocortisone cream on the lesions and it clears them up to some extent but does not make them go  away.  He has never seen a dermatologist or a rheumatologist for these issues.  He is originally from [place] but has been in the [place] since 2009  and has not traveled outside of the [place] since that time.  He works as a Education administrator but does not have any other significant outdoor exposure.   Past Medical History:  Diagnosis Date  . Stroke [facility])     Social History   Tobacco Use  . Smoking status: Never  . Smokeless tobacco: Never  Vaping Use  . Vaping Use: Never used  Substance Use Topics  . Alcohol use: Yes    Comment: drinks several times a week  . Drug use: Not Currently    Comment: rare THC    No family history on file.  No Known Allergies  Review of Systems  Constitutional:  Positive for fever.  Respiratory: Negative.    Cardiovascular: Negative.   Gastrointestinal: Negative.   Genitourinary: Negative.   Musculoskeletal: Negative.   Skin:  Positive for itching.       + chronic lesions + right neck wound  All other systems reviewed and are negative.   OBJECTIVE:   Blood pressure 115/70, pulse (!) 116, temperature 99.4 F (37.4 C), temperature source Oral, resp. rate (!) 28, height 5\' 4"  (1.626 m), weight 70 kg, SpO2 96 %. Body mass index is 26.49 kg/m.  Physical Exam Constitutional:      General: He is not in acute distress.    Appearance: Normal appearance.  HENT:     Head: Normocephalic and atraumatic.     Comments: Extensive scalp dandruff    Nose: Nose normal.  Eyes:     Extraocular Movements: Extraocular movements intact.     Conjunctiva/sclera: Conjunctivae normal.  Neck:     Comments: Right posterior neck wound with thick purulent material draining and erythema surrounding the wound with associated tenderness and induration.  Cardiovascular:     Rate and Rhythm: Normal rate and regular rhythm.  Pulmonary:     Effort: Pulmonary effort is normal. No respiratory distress.     Breath sounds: Normal breath sounds.  Abdominal:     General: There is no distension.     Palpations: Abdomen is soft.     Tenderness: There is no abdominal tenderness.  Musculoskeletal:     Comments: Right arm palsy   Skin:    General: Skin is warm and dry.     Findings: Lesion present.  Neurological:     General: No focal deficit present.     Mental Status: He is alert and oriented to person, place, and time.  Psychiatric:        Mood and Affect: Mood normal.        Behavior: Behavior normal.      Lab Results: Lab Results  Component Value Date   WBC 20.5 (H) 03/14/2022   HGB 15.7 03/14/2022   HCT 45.8 03/14/2022   MCV 94.4 03/14/2022   PLT 180 03/14/2022    Lab Results  Component Value Date   NA 134 (L) 03/14/2022   K 3.9 03/14/2022   CO2 24 03/14/2022   GLUCOSE 110 (H) 03/14/2022   BUN 7 03/14/2022   CREATININE 0.60 (L) 03/14/2022   CALCIUM 8.9 03/14/2022   GFRNONAA >60 03/14/2022   GFRAA >60 12/13/2014    Lab Results  Component Value Date   ALT 24 03/14/2022   AST 18 03/14/2022   ALKPHOS 93 03/14/2022   BILITOT 0.9 03/14/2022  No results found for: "CRP"  No results found for: "ESRSEDRATE"  I have reviewed the micro and lab results in Epic.  Imaging: CT Soft Tissue Neck W Contrast  Result Date: 03/13/2022 CLINICAL DATA:  Initial evaluation for acute soft tissue swelling, infection suspected. EXAM: CT NECK WITH CONTRAST TECHNIQUE: Multidetector CT imaging of the neck was performed using the standard protocol following the bolus administration of intravenous contrast. RADIATION DOSE REDUCTION: This exam was performed according to the departmental dose-optimization program which includes automated exposure control, adjustment of the mA and/or kV according to patient size and/or use of iterative reconstruction technique. CONTRAST:  52mL OMNIPAQUE IOHEXOL 300 MG/ML  SOLN COMPARISON:  None Available. FINDINGS: Pharynx and larynx: Oral cavity within normal limits. No acute abnormality seen about the dentition. Oropharynx and nasopharynx within normal limits. No retropharyngeal collection or swelling. Negative epiglottis. Vallecula clear. Hypopharynx and supraglottic larynx within  normal limits. Glottis normal. Subglottic airway patent clear. Salivary glands: The salivary glands including the parotid and submandibular glands are within normal limits. Thyroid: Normal. Lymph nodes: Asymmetric prominence of right posterior chain cervical lymph nodes, largest of which measures 1 cm in short axis at right level VB, likely reactive. No other enlarged or pathologic adenopathy within the neck. Vascular: Normal intravascular enhancement seen throughout the neck. Limited intracranial: Unremarkable. Visualized orbits: Unremarkable. Mastoids and visualized paranasal sinuses: Mild mucoperiosteal thickening present about the ethmoidal air cells and maxillary sinuses. Paranasal sinuses are otherwise clear. Mastoid air cells and middle ear cavities are largely clear as well. Skeleton: No discrete or worrisome osseous lesions. Upper chest: Visualized upper chest demonstrates no acute finding. Other: Asymmetric soft tissue swelling with inflammatory stranding seen throughout the soft tissues of the right posterolateral neck, concerning for acute infection/cellulitis. Inflammatory changes extend into the lateral aspect of the right neck and towards the right submandibular space. Overlying skin thickening noted. Involvement of the underlying right posterior paraspinous musculature, consistent with associated myositis. No extension to involve the underlying spinal canal by CT. No superimposed loculated abscess or drainable fluid collection. IMPRESSION: 1. Diffuse soft tissue swelling with inflammatory stranding throughout the soft tissues of the right posterolateral neck, consistent with acute infection/cellulitis with myositis. No discrete abscess or drainable fluid collection. 2. Asymmetric prominence of right posterior chain cervical lymph nodes, likely reactive. Electronically Signed   By: Rise Mu M.D.   On: 03/13/2022 19:26   DG Chest Port 1 View  Result Date: 03/13/2022 CLINICAL DATA:   Questionable sepsis, evaluate for abnormality EXAM: PORTABLE CHEST 1 VIEW COMPARISON:  None Available. FINDINGS: Cardiac and mediastinal contours are within normal limits. No focal pulmonary opacity. No pleural effusion or pneumothorax. No acute osseous abnormality. Redemonstrated misalignment the acromioclavicular joint. Presumed surgical clips overlie the right apex and right axilla. IMPRESSION: No acute cardiopulmonary process. Electronically Signed   By: Wiliam Ke M.D.   On: 03/13/2022 17:40     Imaging independently reviewed in Epic.  Vedia Coffer for Infectious Disease [facility] [phone] pager 03/14/2022, 10:49 AM

## 2022-03-14 NOTE — Consult Note (Addendum)
WOC Nurse Consult Note: Reason for Consult: Consult requested to provide topical treatment recommendations for posterior neck.  Performed remotely after review of progress notes and photos in the EMR. Posterior neck with full thickness wound which began as a red area which the patient scratched, according to progress notes. Pt is currently being treated with systemic antibiotics for sepsis from the draining skin wound to posterior neck. Primary team notes indicate; "D/w Dr. Elijah Birk of ENT who agrees with medical treatment, no plan for surgical intervention at this time. He is available as needed." Wound type: Full thickness wound to posterior neck with white-red raised appearance, mod amt yellow drainage.  Topical treatment orders provided for bedside nurses to perform as follows to absorb drainage and provide antimicrobial benefits: Cut piece of Aquacel [place] # [phone]) and apply to posterior next wound Q day, then cover with foam dressing.  (Change foam dressing Q 3 days or PRN soiling.  Moisten with NS each time to remove dressing.)  Please re-consult if further assistance is needed.  Thank-you,  Cammie Mcgee MSN, RN, CWOCN, [place], CNS [phone]

## 2022-03-15 DIAGNOSIS — R6520 Severe sepsis without septic shock: Secondary | ICD-10-CM

## 2022-03-15 DIAGNOSIS — A419 Sepsis, unspecified organism: Secondary | ICD-10-CM | POA: Diagnosis not present

## 2022-03-15 DIAGNOSIS — R21 Rash and other nonspecific skin eruption: Secondary | ICD-10-CM | POA: Diagnosis not present

## 2022-03-15 DIAGNOSIS — S1190XA Unspecified open wound of unspecified part of neck, initial encounter: Secondary | ICD-10-CM

## 2022-03-15 DIAGNOSIS — L03221 Cellulitis of neck: Secondary | ICD-10-CM | POA: Diagnosis not present

## 2022-03-15 LAB — CBC
HCT: 44.5 % (ref 39.0–52.0)
Hemoglobin: 14.8 g/dL (ref 13.0–17.0)
MCH: 32.4 pg (ref 26.0–34.0)
MCHC: 33.3 g/dL (ref 30.0–36.0)
MCV: 97.4 fL (ref 80.0–100.0)
Platelets: 159 10*3/uL (ref 150–400)
RBC: 4.57 MIL/uL (ref 4.22–5.81)
RDW: 11.7 % (ref 11.5–15.5)
WBC: 16 10*3/uL — ABNORMAL HIGH (ref 4.0–10.5)
nRBC: 0 % (ref 0.0–0.2)

## 2022-03-15 LAB — BASIC METABOLIC PANEL
Anion gap: 8 (ref 5–15)
BUN: 8 mg/dL (ref 6–20)
CO2: 25 mmol/L (ref 22–32)
Calcium: 8.5 mg/dL — ABNORMAL LOW (ref 8.9–10.3)
Chloride: 103 mmol/L (ref 98–111)
Creatinine, Ser: 0.53 mg/dL — ABNORMAL LOW (ref 0.61–1.24)
GFR, Estimated: >60 mL/min (ref >60)
Glucose, Bld: 129 mg/dL — ABNORMAL HIGH (ref 70–99)
Potassium: 3.6 mmol/L (ref 3.5–5.1)
Sodium: 136 mmol/L (ref 135–145)

## 2022-03-15 LAB — RPR: RPR Ser Ql: NONREACTIVE

## 2022-03-15 LAB — HEPATITIS B SURFACE ANTIBODY, QUANTITATIVE: Hep B S AB Quant (Post): 40.1 m[IU]/mL (ref >9.9)

## 2022-03-15 MED ORDER — PROSOURCE PLUS PO LIQD
30.0000 mL | Freq: Two times a day (BID) | ORAL | Status: DC
  Administered 2022-03-15 – 2022-03-17 (×5): 30 mL via ORAL
  Filled 2022-03-15 (×4): qty 30

## 2022-03-15 MED ORDER — ADULT MULTIVITAMIN W/MINERALS CH
1.0000 | ORAL_TABLET | Freq: Every day | ORAL | Status: DC
  Administered 2022-03-15 – 2022-03-17 (×3): 1 via ORAL
  Filled 2022-03-15 (×2): qty 1

## 2022-03-15 MED ORDER — JUVEN PO PACK
1.0000 | PACK | Freq: Two times a day (BID) | ORAL | Status: DC
  Administered 2022-03-15 – 2022-03-17 (×4): 1 via ORAL
  Filled 2022-03-15 (×5): qty 1
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
## 2022-03-15 NOTE — Plan of Care (Signed)
  Problem: Respiratory: Goal: Ability to maintain adequate ventilation will improve Outcome: Progressing   Problem: Clinical Measurements: Goal: Cardiovascular complication will be avoided Outcome: Progressing   Problem: Activity: Goal: Risk for activity intolerance will decrease Outcome: Progressing   Problem: Coping: Goal: Level of anxiety will decrease Outcome: Progressing

## 2022-03-15 NOTE — TOC Initial Note (Signed)
Transition of Care [facility]) - Initial/Assessment Note    Patient Details  Name: Jose Yu MRN: [id number] Date of Birth: 04/25/1991  Transition of Care [facility] CM/SW Contact:    Golda Acre, RN Phone Number: 03/15/2022, 9:03 AM  Clinical Narrative:                  Transition of Care [facility]) Screening Note   Patient Details  Name: Jose Yu Date of Birth: [birth year]   Transition of Care [facility]) CM/SW Contact:    Golda Acre, RN Phone Number: 03/15/2022, 9:03 AM    Transition of Care Department [facility]) has reviewed patient and no TOC needs have been identified at this time. We will continue to monitor patient advancement through interdisciplinary progression rounds. If new patient transition needs arise, please place a TOC consult.    Expected Discharge Plan: [facility] Barriers to Discharge: Continued Medical Work up   Patient Goals and CMS Choice Patient states their goals for this hospitalization and ongoing recovery are:: i want to go home but i know i am going back to jail CMS Medicare.gov Compare Post Acute Care list provided to:: Patient Choice offered to / list presented to : NA  Expected Discharge Plan and Services Expected Discharge Plan: Corrections Facility       Living arrangements for the past 2 months: Holiday representative                                      Prior Living Arrangements/Services Living arrangements for the past 2 months: Holiday representative Lives with:: Self Patient language and need for interpreter reviewed:: Yes Do you feel safe going back to the place where you live?: Yes            Criminal Activity/Legal Involvement Pertinent to Current Situation/Hospitalization: No - Comment as needed  Activities of Daily Living Home Assistive Devices/Equipment: None ADL Screening (condition at time of admission) Patient's cognitive ability adequate to safely complete daily activities?: Yes Is  the patient deaf or have difficulty hearing?: No Does the patient have difficulty seeing, even when wearing glasses/contacts?: No Does the patient have difficulty concentrating, remembering, or making decisions?: No Patient able to express need for assistance with ADLs?: Yes Does the patient have difficulty dressing or bathing?: No Independently performs ADLs?: Yes (appropriate for developmental age) Does the patient have difficulty walking or climbing stairs?: No Weakness of Legs: None Weakness of Arms/Hands: Right  Permission Sought/Granted                  Emotional Assessment Appearance:: Appears stated age Attitude/Demeanor/Rapport: Engaged Affect (typically observed): Constricted Orientation: : Oriented to Self, Oriented to Place, Oriented to  Time, Oriented to Situation Alcohol / Substance Use: Alcohol Use Psych Involvement: No (comment)  Admission diagnosis:  Cellulitis [L03.90] Sepsis (HCC) [A41.9] Sepsis with acute organ dysfunction without septic shock, due to unspecified organism, unspecified type (HCC) [A41.9, R65.20] Patient Active Problem List   Diagnosis Date Noted   Hyponatremia 03/14/2022   Sepsis (HCC) 03/13/2022   Rash 03/13/2022   Cellulitis 03/13/2022   PCP:  Patient, No Pcp Per Pharmacy:   [id number] - [place], [place] - [address] [place] [address] Phone: [phone] Fax: [phone]     Social Determinants of Health (SDOH) Interventions    Readmission Risk Interventions     No data to display

## 2022-03-15 NOTE — Progress Notes (Signed)
[facility]  Date of Admission:  03/13/2022           Reason for visit: Follow up on wound infection  Current antibiotics: Vancomycin    ASSESSMENT:    31 y.o. male admitted with:  Infected right posterior neck wound: Patient presented 03/13/2022 with sepsis secondary to this wound initially having developed as a furuncle that he subsequently scratched and became secondarily infected.  CT scan with contrast on admission notable for diffuse soft tissue swelling with inflammatory stranding throughout the soft tissues of the right posterior lateral neck consistent with acute infection/cellulitis with myositis.  No abscess or drainable fluid collection.  Blood cultures are currently no growth. Multiple cutaneous lesions and seborrheic dermatitis: These other lesions are more chronic and not consistent with active infection.  They have been present for 6 months or longer.  They appear more psoriatic in nature and he reports prior improvement with topical steroid cream. Sepsis: Secondary to #1.  RECOMMENDATIONS:    Continue vancomycin per pharmacy Follow blood cultures Wound care Topical steroid for his other cutaneous lesions Likely to benefit from dermatology or rheumatology evaluation after discharge Will follow   Principal Problem:   Sepsis (HCC) Active Problems:   Rash   Cellulitis   Hyponatremia    MEDICATIONS:    Scheduled Meds:  Chlorhexidine Gluconate Cloth  6 each Topical Q0600   enoxaparin (LOVENOX) injection  40 mg Subcutaneous Q24H   feeding supplement  237 mL Oral BID BM   mupirocin ointment  1 Application Nasal BID   pneumococcal 20-valent conjugate vaccine  0.5 mL Intramuscular Tomorrow-1000   Continuous Infusions:  lactated ringers 125 mL/hr at 03/15/22 0334   vancomycin 1,250 mg (03/15/22 0550)   PRN Meds:.acetaminophen **OR** acetaminophen, acetaminophen, HYDROcodone-acetaminophen, hydrocortisone cream  SUBJECTIVE:    24 hour events:  Patient febrile overnight Tmax 103.1 Remotely seen by wound care who provided recommendations based on pictures WBC improved Creatinine stable No evidence of chronic hepatitis or HIV RPR pending  Patient states that he is feeling a little better today.  His pain is improved.  He had a fever overnight but none currently.  Review of Systems  All other systems reviewed and are negative.     OBJECTIVE:   Blood pressure (!) 141/81, pulse 98, temperature 98.3 F (36.8 C), resp. rate 17, height 5\' 4"  (1.626 m), weight 70 kg, SpO2 97 %. Body mass index is 26.49 kg/m.  Physical Exam Constitutional:      General: He is not in acute distress.    Appearance: Normal appearance.     Comments: Patient seen with help of Spanish language interpreter  HENT:     Head: Normocephalic and atraumatic.  Eyes:     Extraocular Movements: Extraocular movements intact.     Conjunctiva/sclera: Conjunctivae normal.  Neck:     Comments: Right posterior neck wound with improved erythema, swelling, tenderness.  Purulent drainage remains. Pulmonary:     Effort: Pulmonary effort is normal. No respiratory distress.  Abdominal:     General: There is no distension.     Palpations: Abdomen is soft.  Skin:    General: Skin is warm and dry.     Findings: Lesion present.     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     Mental Status: He is alert and oriented to person, place, and time.  Psychiatric:  Behavior: Behavior normal.      Lab Results: Lab Results  Component Value Date   WBC 16.0 (H) 03/15/2022   HGB 14.8 03/15/2022   HCT 44.5 03/15/2022   MCV 97.4 03/15/2022   PLT 159 03/15/2022    Lab Results  Component Value Date   NA 136 03/15/2022   K 3.6 03/15/2022   CO2 25 03/15/2022   GLUCOSE 129 (H) 03/15/2022   BUN 8 03/15/2022   CREATININE 0.53 (L) 03/15/2022   CALCIUM 8.5 (L)  03/15/2022   GFRNONAA >60 03/15/2022   GFRAA >60 12/13/2014    Lab Results  Component Value Date   ALT 24 03/14/2022   AST 18 03/14/2022   ALKPHOS 93 03/14/2022   BILITOT 0.9 03/14/2022    No results found for: "CRP"  No results found for: "ESRSEDRATE"   I have reviewed the micro and lab results in Epic.  Imaging: CT Soft Tissue Neck W Contrast  Result Date: 03/13/2022 CLINICAL DATA:  Initial evaluation for acute soft tissue swelling, infection suspected. EXAM: CT NECK WITH CONTRAST TECHNIQUE: Multidetector CT imaging of the neck was performed using the standard protocol following the bolus administration of intravenous contrast. RADIATION DOSE REDUCTION: This exam was performed according to the departmental dose-optimization program which includes automated exposure control, adjustment of the mA and/or kV according to patient size and/or use of iterative reconstruction technique. CONTRAST:  26mL OMNIPAQUE IOHEXOL 300 MG/ML  SOLN COMPARISON:  None Available. FINDINGS: Pharynx and larynx: Oral cavity within normal limits. No acute abnormality seen about the dentition. Oropharynx and nasopharynx within normal limits. No retropharyngeal collection or swelling. Negative epiglottis. Vallecula clear. Hypopharynx and supraglottic larynx within normal limits. Glottis normal. Subglottic airway patent clear. Salivary glands: The salivary glands including the parotid and submandibular glands are within normal limits. Thyroid: Normal. Lymph nodes: Asymmetric prominence of right posterior chain cervical lymph nodes, largest of which measures 1 cm in short axis at right level VB, likely reactive. No other enlarged or pathologic adenopathy within the neck. Vascular: Normal intravascular enhancement seen throughout the neck. Limited intracranial: Unremarkable. Visualized orbits: Unremarkable. Mastoids and visualized paranasal sinuses: Mild mucoperiosteal thickening present about the ethmoidal air cells and  maxillary sinuses. Paranasal sinuses are otherwise clear. Mastoid air cells and middle ear cavities are largely clear as well. Skeleton: No discrete or worrisome osseous lesions. Upper chest: Visualized upper chest demonstrates no acute finding. Other: Asymmetric soft tissue swelling with inflammatory stranding seen throughout the soft tissues of the right posterolateral neck, concerning for acute infection/cellulitis. Inflammatory changes extend into the lateral aspect of the right neck and towards the right submandibular space. Overlying skin thickening noted. Involvement of the underlying right posterior paraspinous musculature, consistent with associated myositis. No extension to involve the underlying spinal canal by CT. No superimposed loculated abscess or drainable fluid collection. IMPRESSION: 1. Diffuse soft tissue swelling with inflammatory stranding throughout the soft tissues of the right posterolateral neck, consistent with acute infection/cellulitis with myositis. No discrete abscess or drainable fluid collection. 2. Asymmetric prominence of right posterior chain cervical lymph nodes, likely reactive. Electronically Signed   By: Rise Mu M.D.   On: 03/13/2022 19:26   DG Chest Port 1 View  Result Date: 03/13/2022 CLINICAL DATA:  Questionable sepsis, evaluate for abnormality EXAM: PORTABLE CHEST 1 VIEW COMPARISON:  None Available. FINDINGS: Cardiac and mediastinal contours are within normal limits. No focal pulmonary opacity. No pleural effusion or pneumothorax. No acute osseous abnormality. Redemonstrated misalignment the acromioclavicular joint. Presumed surgical clips  overlie the right apex and right axilla. IMPRESSION: No acute cardiopulmonary process. Electronically Signed   By: Wiliam Ke M.D.   On: 03/13/2022 17:40     Imaging independently reviewed in Epic.    [facility] [phone] pager 03/15/2022,  7:40 AM

## 2022-03-16 DIAGNOSIS — L03221 Cellulitis of neck: Secondary | ICD-10-CM | POA: Diagnosis not present

## 2022-03-16 DIAGNOSIS — A419 Sepsis, unspecified organism: Secondary | ICD-10-CM | POA: Diagnosis not present

## 2022-03-16 DIAGNOSIS — R21 Rash and other nonspecific skin eruption: Secondary | ICD-10-CM | POA: Diagnosis not present

## 2022-03-16 LAB — CBC
HCT: 45.3 % (ref 39.0–52.0)
Hemoglobin: 15.1 g/dL (ref 13.0–17.0)
MCH: 32 pg (ref 26.0–34.0)
MCHC: 33.3 g/dL (ref 30.0–36.0)
MCV: 96 fL (ref 80.0–100.0)
Platelets: 187 10*3/uL (ref 150–400)
RBC: 4.72 MIL/uL (ref 4.22–5.81)
RDW: 11.5 % (ref 11.5–15.5)
WBC: 12.9 10*3/uL — ABNORMAL HIGH (ref 4.0–10.5)
nRBC: 0 % (ref 0.0–0.2)

## 2022-03-16 LAB — BASIC METABOLIC PANEL
Anion gap: 9 (ref 5–15)
BUN: 12 mg/dL (ref 6–20)
CO2: 24 mmol/L (ref 22–32)
Calcium: 9.2 mg/dL (ref 8.9–10.3)
Chloride: 103 mmol/L (ref 98–111)
Creatinine, Ser: 0.44 mg/dL — ABNORMAL LOW (ref 0.61–1.24)
GFR, Estimated: >60 mL/min (ref >60)
Glucose, Bld: 88 mg/dL (ref 70–99)
Potassium: 3.7 mmol/L (ref 3.5–5.1)
Sodium: 136 mmol/L (ref 135–145)

## 2022-03-16 MED ORDER — HYDROCORTISONE 1 % EX CREA
TOPICAL_CREAM | CUTANEOUS | 0 refills | Status: DC | PRN
Start: 2022-03-16 — End: 2022-03-17

## 2022-03-16 MED ORDER — LINEZOLID 600 MG PO TABS: 600.0000 mg | ORAL_TABLET | Freq: Two times a day (BID) | ORAL | 0 refills | Status: DC

## 2022-03-16 MED ORDER — ADULT MULTIVITAMIN W/MINERALS CH: 1.0000 | ORAL_TABLET | Freq: Every day | ORAL | 0 refills | Status: DC

## 2022-03-16 MED ORDER — MUPIROCIN 2 % EX OINT: 1.0000 | TOPICAL_OINTMENT | Freq: Two times a day (BID) | CUTANEOUS | 0 refills | Status: AC

## 2022-03-16 MED ORDER — LINEZOLID 600 MG PO TABS
600.0000 mg | ORAL_TABLET | Freq: Two times a day (BID) | ORAL | Status: DC
  Administered 2022-03-16 – 2022-03-17 (×2): 600 mg via ORAL
  Filled 2022-03-16 (×2): qty 1

## 2022-03-16 MED ORDER — HYDROCODONE-ACETAMINOPHEN 5-325 MG PO TABS: 1.0000 | ORAL_TABLET | Freq: Four times a day (QID) | ORAL | 0 refills | Status: DC | PRN

## 2022-03-16 MED ORDER — ACETAMINOPHEN 325 MG PO TABS: 650.0000 mg | ORAL_TABLET | Freq: Four times a day (QID) | ORAL | 0 refills | Status: DC | PRN
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
## 2022-03-16 NOTE — Progress Notes (Signed)
[facility]  Date of Admission:  03/13/2022           Reason for visit: Follow up on neck wound infection  Current antibiotics: Vancomycin  ASSESSMENT:    31 y.o. male admitted with:  Infected right posterior neck wound: Presented 03/13/2022 with sepsis secondary to this wound initially having developed as a furuncle that he subsequently scratched and became secondarily infected.  His sepsis physiology appears to be improving and has now been afebrile with improving leukocytosis.  He had a CT scan with contrast on admission that was notable for diffuse soft tissue swelling with inflammatory stranding throughout the soft tissues of the right posterior lateral neck.  This was consistent with acute infection/cellulitis and myositis.  No abscess or drainable fluid collection was noted.  His blood cultures are currently no growth. Multiple cutaneous lesions and seborrheic dermatitis: These other lesions appear more chronic and not consistent with active infection.  They have been present for several months or longer.  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 Sepsis: Secondary to #1 and improving.   RECOMMENDATIONS:    Change antibiotics to linezolid 600mg  PO BID Will look into whether this can be provided at facility If so, okay to discharge from my perspective Would treat for 2 weeks ending on 03/27/22 Hopefully can get in with dermatology or rheumatology outpatient Please call with any questions   Principal Problem:   Sepsis (HCC) Active Problems:   Rash   Cellulitis   Hyponatremia   Open neck wound    MEDICATIONS:    Scheduled Meds:  (feeding supplement) PROSource Plus  30 mL Oral BID BM   Chlorhexidine Gluconate Cloth  6 each Topical Q0600   enoxaparin (LOVENOX) injection  40 mg Subcutaneous Q24H   feeding supplement  237 mL Oral BID BM   multivitamin with minerals  1 tablet Oral Daily    mupirocin ointment  1 Application Nasal BID   nutrition supplement (JUVEN)  1 packet Oral BID BM   Continuous Infusions:  vancomycin 1,250 mg (03/16/22 0608)   PRN Meds:.acetaminophen **OR** acetaminophen, acetaminophen, HYDROcodone-acetaminophen, hydrocortisone cream  SUBJECTIVE:   24 hour events:  No acute events have been noted overnight WBC continues to improve Renal function is stable Blood cultures remain negative Fever curve improving with a Tmax of 99.9 F over the last 24 hours RPR nonreactive  Doing better today.  No fevers, appetite is good.  He has no other complaints and pain is improved.   Review of Systems  All other systems reviewed and are negative.     OBJECTIVE:   Blood pressure 124/69, pulse (!) 101, temperature 99.9 F (37.7 C), temperature source Oral, resp. rate 20, height 5\' 4"  (1.626 m), weight 66.3 kg, SpO2 98 %. Body mass index is 25.1 kg/m.  Physical Exam Constitutional:      General: He is not in acute distress.    Appearance: Normal appearance.  HENT:     Head: Normocephalic and atraumatic.  Eyes:     Extraocular Movements: Extraocular movements intact.     Conjunctiva/sclera: Conjunctivae normal.  Pulmonary:     Effort: Pulmonary effort is normal. No respiratory distress.  Abdominal:     General: There is no distension.     Palpations: Abdomen is soft.  Musculoskeletal:        General: Normal range of motion.     Cervical back: Normal range of motion  and neck supple.  Skin:    General: Skin is warm and dry.     Findings: Lesion present.     Comments: Neck wound purulent drainage appears improved today. Erythema, induration, and tenderness are improved.   Neurological:     General: No focal deficit present.     Mental Status: He is alert and oriented to person, place, and time.  Psychiatric:        Mood and Affect: Mood normal.        Behavior: Behavior normal.      Lab Results: Lab Results  Component Value Date   WBC  12.9 (H) 03/16/2022   HGB 15.1 03/16/2022   HCT 45.3 03/16/2022   MCV 96.0 03/16/2022   PLT 187 03/16/2022    Lab Results  Component Value Date   NA 136 03/16/2022   K 3.7 03/16/2022   CO2 24 03/16/2022   GLUCOSE 88 03/16/2022   BUN 12 03/16/2022   CREATININE 0.44 (L) 03/16/2022   CALCIUM 9.2 03/16/2022   GFRNONAA >60 03/16/2022   GFRAA >60 12/13/2014    Lab Results  Component Value Date   ALT 24 03/14/2022   AST 18 03/14/2022   ALKPHOS 93 03/14/2022   BILITOT 0.9 03/14/2022    No results found for: "CRP"  No results found for: "ESRSEDRATE"   I have reviewed the micro and lab results in Epic.  Imaging: No results found.   Imaging independently reviewed in Epic.    Vedia Coffer for Infectious Disease [facility] [phone] pager 03/16/2022, 8:17 AM

## 2022-03-16 NOTE — Progress Notes (Addendum)
Pharmacy Antibiotic Note  Jose Yu is a 31 y.o. male admitted on 03/13/2022 with a significant wound to the back of the neck. CT soft tissue of the neck showed diffuse soft tissue swelling w/ inflammatory stranding consistent with acute infection/cellulitis with myositis.   Pharmacy has been consulted for vancomycin dosing. 03/16/2022 Day #4 vancomycin. T max 99.9, now afebrile x 24 hrs WBC trending down SCr WNL  Plan: Vancomcyin transitioned to zyvox 600 mg po bid x 11 days by ID  Height: 5\' 4"  (162.6 cm) Weight: 66.3 kg (146 lb 3.2 oz) IBW/kg (Calculated) : 59.2  Temp (24hrs), Avg:99.3 F (37.4 C), Min:98.6 F (37 C), Max:99.9 F (37.7 C)  Recent Labs  Lab 03/13/22 1748 03/14/22 0420 03/15/22 0509 03/16/22 0543  WBC 22.4* 20.5* 16.0* 12.9*  CREATININE 0.63 0.60* 0.53* 0.44*  LATICACIDVEN 1.2  --   --   --      Estimated Creatinine Clearance: 111 mL/min (A) (by C-G formula based on SCr of 0.44 mg/dL (L)).    No Known Allergies  Antimicrobials this admission: Ceftriaxone 8/8 x 1  Vancomycin 8/8 >>   Dose adjustments this admission:  Microbiology results: 8/8 BCx2 ngtd 8/8 MRSA + 8/9 RPR NR 8/9 HIV NR 8/9 Hep B &C NR  Thank you for allowing pharmacy to be a part of this patient's care.  10/9, Pharm.D 03/16/2022 8:58 AM

## 2022-03-16 NOTE — TOC Progression Note (Addendum)
Transition of Care [facility]) - Progression Note    Patient Details  Name: Jose Yu MRN: [id number] Date of Birth: August 13, 1990  [facility] CM/SW Contact  Mayar Whittier, Olegario Messier, RN Phone Number: 03/16/2022, 1:48 PM  Clinical Narrative:I have contacted the nurse(Dottie) [phone]  [facility]-they will order the linezolid, & other meds to have available for d/c tomorrow. Please fax script to fax#[phone]. -3p-scripts faxed to [phone].       Expected Discharge Plan: [facility] Barriers to Discharge: Continued Medical Work up  Expected Discharge Plan and Services Expected Discharge Plan: [facility] Facility       Living arrangements for the past 2 months: Holiday representative                                       Social Determinants of Health (SDOH) Interventions    Readmission Risk Interventions     No data to display

## 2022-03-17 ENCOUNTER — Other Ambulatory Visit (HOSPITAL_COMMUNITY): Payer: Self-pay

## 2022-03-17 DIAGNOSIS — A419 Sepsis, unspecified organism: Secondary | ICD-10-CM | POA: Diagnosis not present

## 2022-03-17 MED ORDER — ADULT MULTIVITAMIN W/MINERALS CH: 1.0000 | ORAL_TABLET | Freq: Every day | ORAL | 0 refills | Status: AC

## 2022-03-17 MED ORDER — LINEZOLID 600 MG PO TABS: 600.0000 mg | ORAL_TABLET | Freq: Two times a day (BID) | ORAL | 0 refills | Status: AC

## 2022-03-17 MED ORDER — ACETAMINOPHEN 325 MG PO TABS: 650.0000 mg | ORAL_TABLET | Freq: Four times a day (QID) | ORAL | 0 refills | Status: AC | PRN

## 2022-03-17 MED ORDER — HYDROCODONE-ACETAMINOPHEN 5-325 MG PO TABS: 1.0000 | ORAL_TABLET | ORAL | 0 refills | Status: AC | PRN

## 2022-03-17 MED ORDER — LINEZOLID 600 MG PO TABS: 600.0000 mg | ORAL_TABLET | Freq: Two times a day (BID) | ORAL | 0 refills | Status: DC

## 2022-03-17 MED ORDER — LINEZOLID 600 MG PO TABS
600.0000 mg | ORAL_TABLET | Freq: Two times a day (BID) | ORAL | 0 refills | Status: DC
  Filled 2022-03-17: qty 6, 3d supply, fill #0

## 2022-03-17 MED ORDER — HYDROCORTISONE 1 % EX CREA: TOPICAL_CREAM | CUTANEOUS | 0 refills | Status: AC | PRN

## 2022-03-17 MED ORDER — LINEZOLID 600 MG PO TABS
600.0000 mg | ORAL_TABLET | Freq: Two times a day (BID) | ORAL | 0 refills | Status: DC
Start: 2022-03-17 — End: 2022-03-17
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
## 2022-03-17 NOTE — TOC Transition Note (Signed)
Transition of Care [facility]) - CM/SW Discharge Note   Patient Details  Name: Jose Yu MRN: [id number] Date of Birth: 11-Jan-1991  [facility] CM/SW Contact:  Jose Cleaver, LCSW Phone Number: 03/17/2022, 1:23 PM   Clinical Narrative:     [facility]  Patient will be returning back today.  CSW was informed that the prison is not able to get his antibiotic till Tuesday.  CSW spoke to attending physician, outpatient pharmacy, and charge nurse Jose Yu in regards to getting the medications for this patient.  CSW notified weekend on call supervisor Jose Yu who approved using petty cash for patient who needs 6 pills for discharge today.  Charge nurse Jose Yu confirmed the cost with [facility] will be $25.14, CSW took the money out of petty cash, filled petty cash receipt, attached pharmacy receipt, and updated weekend on call supervisor Jose Yu.  CSW to sign off, please reconsult if other TOC needs arise.  Final next level of care: [facility]rections Facility Barriers to Discharge: Barriers Resolved   Patient Goals and CMS Choice Patient states their goals for this hospitalization and ongoing recovery are:: To return back to [email or web address].gov Compare Post Acute Care list provided to:: Patient Choice offered to / list presented to : NA  Discharge Placement                Patient to be transferred to facility by: Law Enforcement      Discharge Plan and Services                                     Social Determinants of Health (SDOH) Interventions     Readmission Risk Interventions     No data to display

## 2022-03-17 NOTE — Plan of Care (Signed)
  Problem: Fluid Volume: Goal: Hemodynamic stability will improve Outcome: Completed/Met   Problem: Clinical Measurements: Goal: Diagnostic test results will improve Outcome: Completed/Met Goal: Signs and symptoms of infection will decrease Outcome: Completed/Met   Problem: Respiratory: Goal: Ability to maintain adequate ventilation will improve Outcome: Completed/Met   Problem: Clinical Measurements: Goal: Ability to avoid or minimize complications of infection will improve Outcome: Completed/Met   Problem: Skin Integrity: Goal: Skin integrity will improve Outcome: Completed/Met   Problem: Education: Goal: Knowledge of General Education information will improve Description: Including pain rating scale, medication(s)/side effects and non-pharmacologic comfort measures Outcome: Completed/Met   Problem: Health Behavior/Discharge Planning: Goal: Ability to manage health-related needs will improve Outcome: Completed/Met   Problem: Clinical Measurements: Goal: Ability to maintain clinical measurements within normal limits will improve Outcome: Completed/Met Goal: Will remain free from infection Outcome: Completed/Met Goal: Diagnostic test results will improve Outcome: Completed/Met Goal: Respiratory complications will improve Outcome: Completed/Met Goal: Cardiovascular complication will be avoided Outcome: Completed/Met   Problem: Activity: Goal: Risk for activity intolerance will decrease Outcome: Completed/Met   Problem: Nutrition: Goal: Adequate nutrition will be maintained Outcome: Completed/Met   Problem: Coping: Goal: Level of anxiety will decrease Outcome: Completed/Met   Problem: Elimination: Goal: Will not experience complications related to bowel motility Outcome: Completed/Met Goal: Will not experience complications related to urinary retention Outcome: Completed/Met   Problem: Pain Managment: Goal: General experience of comfort will  improve Outcome: Completed/Met   Problem: Safety: Goal: Ability to remain free from injury will improve Outcome: Completed/Met   Problem: Skin Integrity: Goal: Risk for impaired skin integrity will decrease Outcome: Completed/Met

## 2022-03-18 LAB — CULTURE, BLOOD (ROUTINE X 2)
Culture: NO GROWTH
Culture: NO GROWTH

## 2022-03-24 ENCOUNTER — Other Ambulatory Visit (HOSPITAL_COMMUNITY): Payer: Self-pay

## 2022-03-26 ENCOUNTER — Other Ambulatory Visit (HOSPITAL_COMMUNITY): Payer: Self-pay

## 2022-03-28 ENCOUNTER — Other Ambulatory Visit (HOSPITAL_COMMUNITY): Payer: Self-pay

## 2022-03-31 ENCOUNTER — Other Ambulatory Visit (HOSPITAL_COMMUNITY): Payer: Self-pay

## 2022-04-10 ENCOUNTER — Other Ambulatory Visit (HOSPITAL_COMMUNITY): Payer: Self-pay

## 2023-01-18 ENCOUNTER — Emergency Department (HOSPITAL_COMMUNITY)
Admission: EM | Admit: 2023-01-18 | Discharge: 2023-01-18 | Disposition: A | Discharge status: Home / Self Care | Attending: Emergency Medicine | Admitting: Emergency Medicine

## 2023-01-18 ENCOUNTER — Encounter (HOSPITAL_COMMUNITY): Payer: Self-pay

## 2023-01-18 ENCOUNTER — Other Ambulatory Visit: Payer: Self-pay

## 2023-01-18 DIAGNOSIS — F10920 Alcohol use, unspecified with intoxication, uncomplicated: Secondary | ICD-10-CM | POA: Diagnosis present

## 2023-01-18 NOTE — Discharge Instructions (Signed)
Use your alcohol ingestion and drink plenty of water

## 2023-01-18 NOTE — ED Triage Notes (Addendum)
Patient brought in by [facility], found by bystander in yard intoxicated. Has been drinking for the last 6 days on steel reserves. Glucose 102 pta.

## 2023-01-18 NOTE — ED Provider Notes (Signed)
[name] EMERGENCY DEPARTMENT AT [facility] Provider Note   CSN: [id number] Arrival date & time: 01/18/23  2116     History  Chief Complaint  Patient presents with   Alcohol Intoxication    Jose Yu is a 32 y.o. male.  Patient has been drinking alcohol.  He was found inebriated and brought to the emergency department.  Patient states he wants to go home he needs to go to work tomorrow.  Patient is oriented x 4  The history is provided by the patient and medical records. No language interpreter was used.  Alcohol Intoxication This is a chronic problem. The current episode started 12 to 24 hours ago. The problem occurs constantly. The problem has been gradually improving. Pertinent negatives include no chest pain, no abdominal pain and no headaches. Nothing aggravates the symptoms. Nothing relieves the symptoms. He has tried nothing for the symptoms. The treatment provided no relief.       Home Medications Prior to Admission medications   Medication Sig Start Date End Date Taking? Authorizing Provider  acetaminophen (TYLENOL) 325 MG tablet Take 2 tablets (650 mg total) by mouth every 6 (six) hours as needed for mild pain (or Fever >/= 101). 03/17/22   Regalado, Belkys A, MD  HYDROcodone-acetaminophen (NORCO/VICODIN) 5-325 MG tablet Take 1-2 tablets by mouth every 4 (four) hours as needed for moderate pain. 03/17/22   Regalado, Belkys A, MD  hydrocortisone cream 1 % Apply topically as needed for itching (Apply 2 to 3 times daily as needed to skin lesions except the lesion on the neck.). 03/17/22   Regalado, Jon Billings A, MD  Multiple Vitamin (MULTIVITAMIN WITH MINERALS) TABS tablet Take 1 tablet by mouth daily. 03/17/22   Regalado, Belkys A, MD  mupirocin ointment (BACTROBAN) 2 % Place 1 Application into the nose 2 (two) times daily. 03/16/22   Regalado, Prentiss Bells, MD      Allergies    Patient has no known allergies.    Review of Systems   Review of Systems   Constitutional:  Negative for appetite change and fatigue.  HENT:  Negative for congestion, ear discharge and sinus pressure.   Eyes:  Negative for discharge.  Respiratory:  Negative for cough.   Cardiovascular:  Negative for chest pain.  Gastrointestinal:  Negative for abdominal pain and diarrhea.  Genitourinary:  Negative for frequency and hematuria.  Musculoskeletal:  Negative for back pain.  Skin:  Negative for rash.  Neurological:  Negative for seizures and headaches.  Psychiatric/Behavioral:  Negative for hallucinations.     Physical Exam Updated Vital Signs BP (!) 149/86 (BP Location: Left Arm)   Pulse (!) 133   Temp 98.4 F (36.9 C) (Oral)   Resp 17   SpO2 93%  Physical Exam Vitals and nursing note reviewed.  Constitutional:      Appearance: He is well-developed.  HENT:     Head: Normocephalic.     Nose: Nose normal.  Eyes:     General: No scleral icterus.    Conjunctiva/sclera: Conjunctivae normal.  Neck:     Thyroid: No thyromegaly.  Cardiovascular:     Rate and Rhythm: Normal rate and regular rhythm.     Heart sounds: No murmur heard.    No friction rub. No gallop.  Pulmonary:     Breath sounds: No stridor. No wheezing or rales.     Comments: Patient smells of alcohol Chest:     Chest wall: No tenderness.  Abdominal:     General:  There is no distension.     Tenderness: There is no abdominal tenderness. There is no rebound.  Musculoskeletal:        General: Normal range of motion.     Cervical back: Neck supple.  Lymphadenopathy:     Cervical: No cervical adenopathy.  Skin:    Findings: No erythema or rash.  Neurological:     Mental Status: He is alert and oriented to person, place, and time.     Motor: No abnormal muscle tone.     Coordination: Coordination normal.     Comments: Patient able to ambulate without problems  Psychiatric:        Behavior: Behavior normal.     ED Results / Procedures / Treatments   Labs (all labs ordered are  listed, but only abnormal results are displayed) Labs Reviewed - No data to display  EKG None  Radiology No results found.  Procedures Procedures    Medications Ordered in ED Medications - No data to display  ED Course/ Medical Decision Making/ A&P  Patient moderately inebriated.  He is alert and oriented x 4 and able to ambulate without falling.  Patient does not want to stay in the emergency department and wants to go home                           Medical Decision Making  Alcohol abuse.  Patient will be discharged home.  Patient does not want any tests done        Final Clinical Impression(s) / ED Diagnoses Final diagnoses:  Alcoholic intoxication without complication [name]    Rx / DC Orders ED Discharge Orders     None         [name], MD 01/19/23 1110
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
## 2023-11-17 IMAGING — CT CT HEAD W/O CM
3 series · 14 of 47 positions shown, 16 images · non-contrast
Comparison: None Available.

CLINICAL DATA: Head trauma, abnormal mental status (Age 18-64y);
Neck trauma, intoxicated or obtunded (Age >= 16y)



[Series 3: head wo · axial · 0.47mm/px · z∈[-132,+3]mm · 8 of 33 slices shown, 10 images]
[im 3/33  brain]
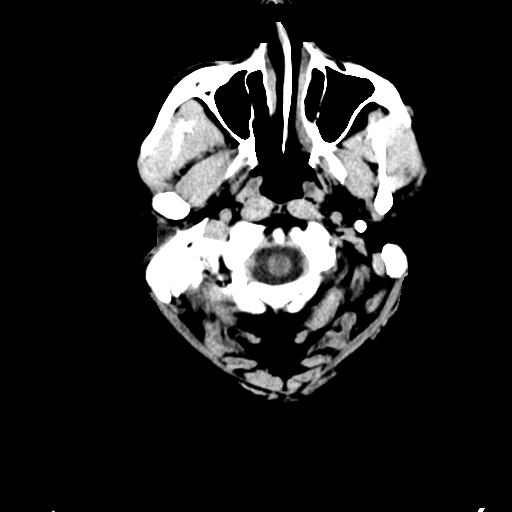
[im 3/33  bone]
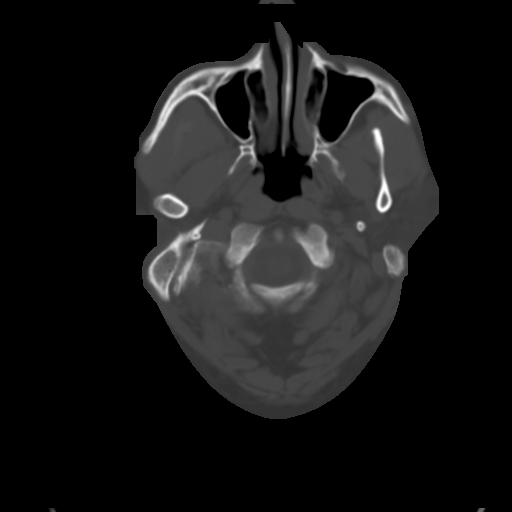
[im 7/33  brain]
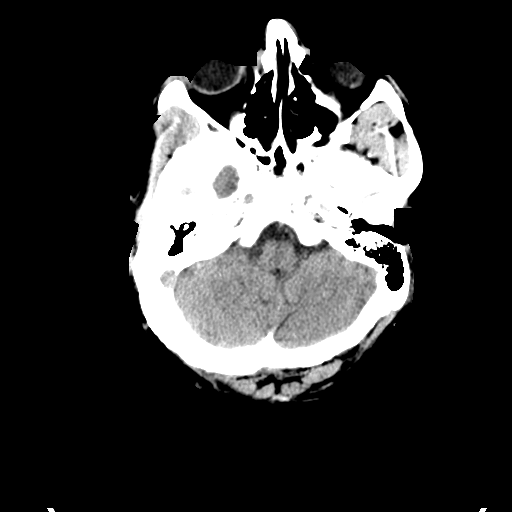
[im 10/33  brain]
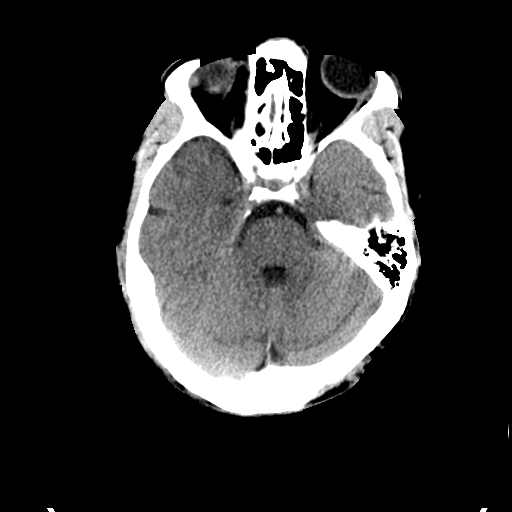
[im 15/33  brain]
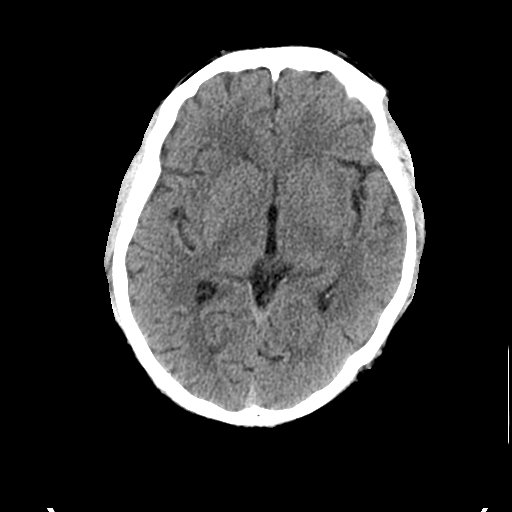
[im 18/33  brain]
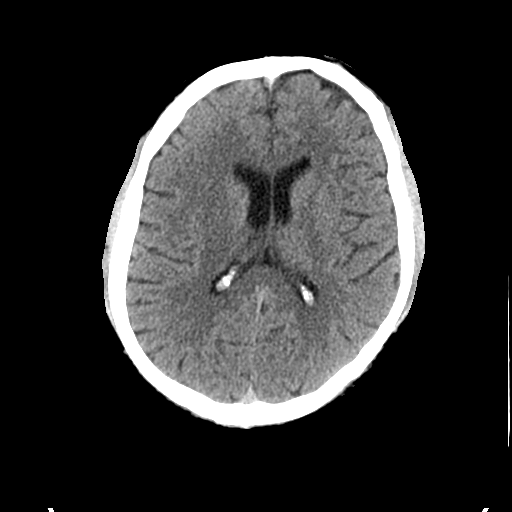
[im 18/33  bone]
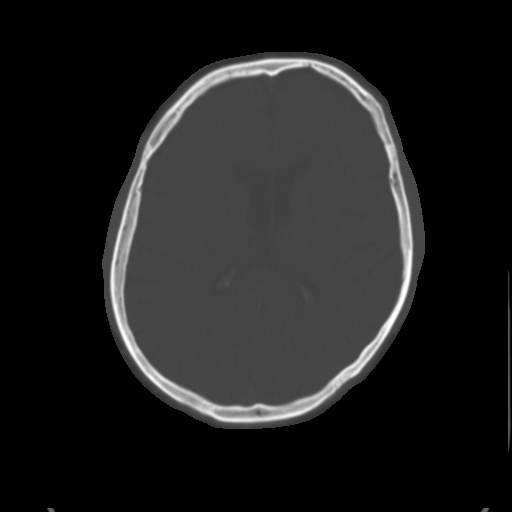
[im 23/33  brain]
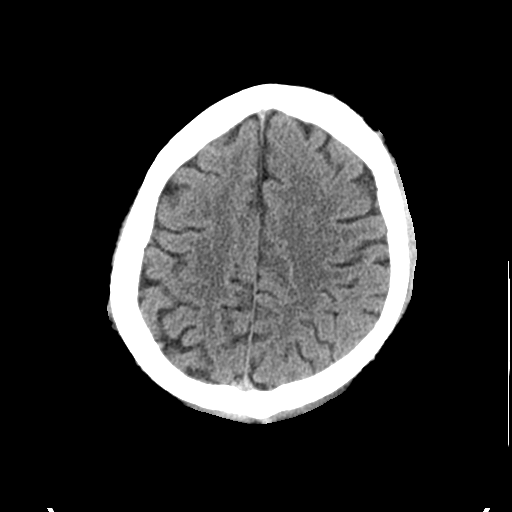
[im 26/33  brain]
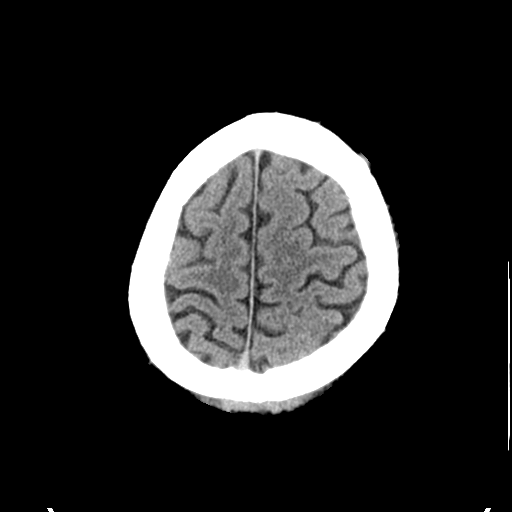
[im 30/33  brain]
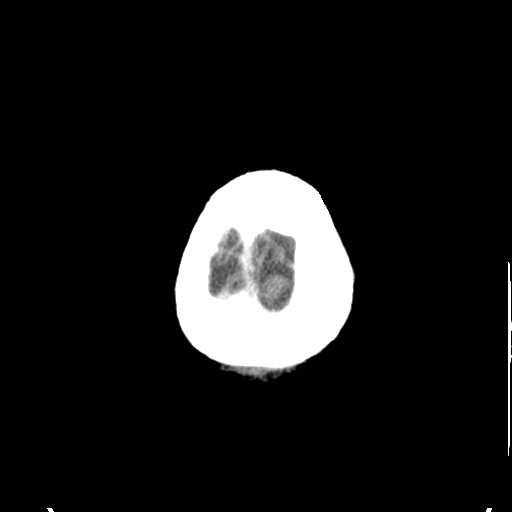

[Series 6: coronal soft tissue · coronal · 0.34mm/px · 3 of 77 slices shown]
[im 26/77  brain]
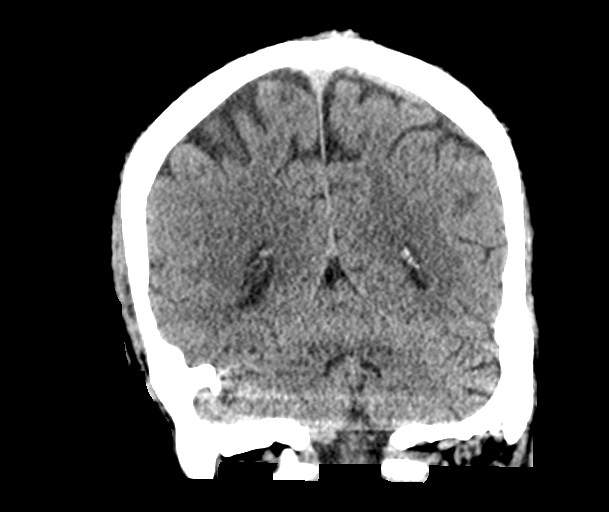
[im 34/77  brain]
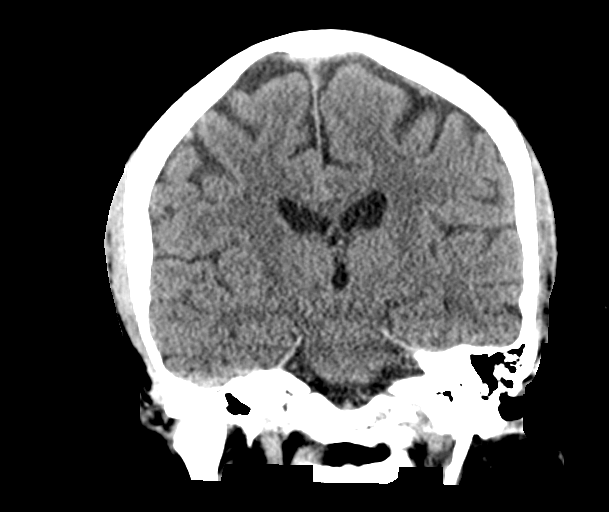
[im 43/77  brain]
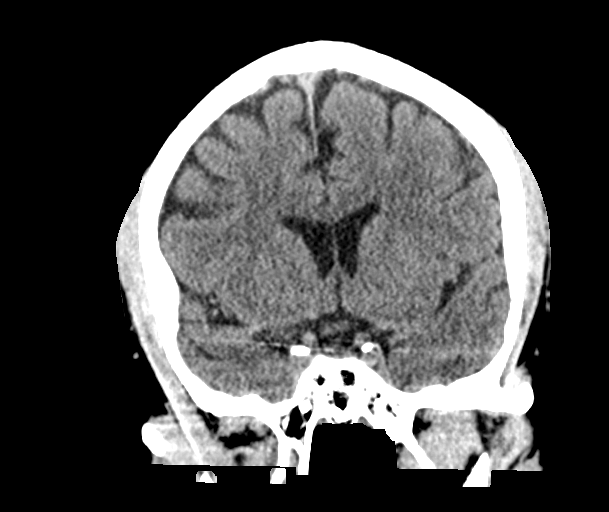

[Series 7: sagittal soft tissue · sagittal · 0.32mm/px · 3 of 67 slices shown]
[im 23/67  brain]
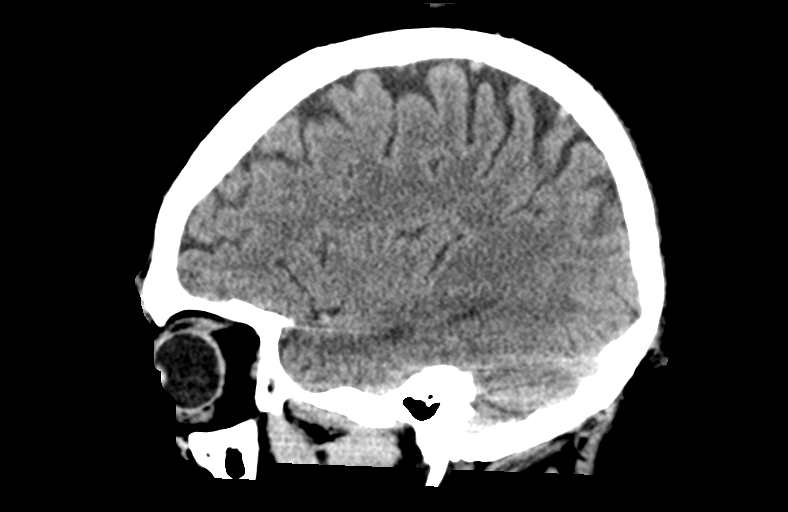
[im 34/67  brain]
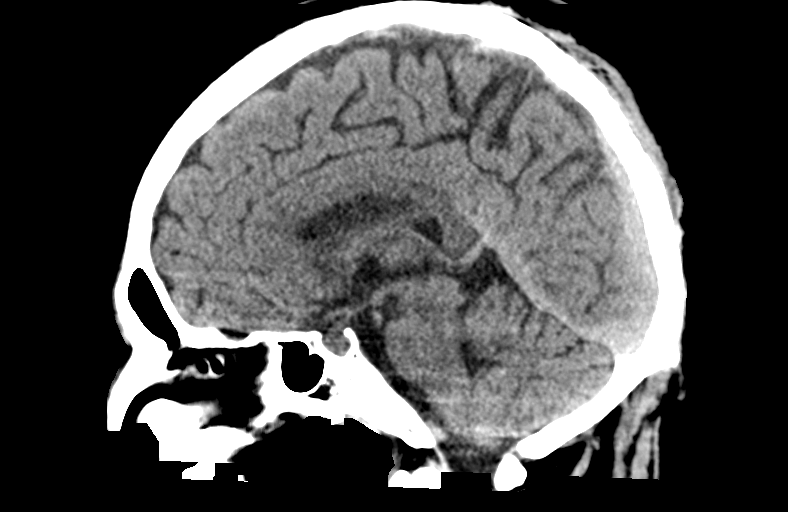
[im 45/67  brain]
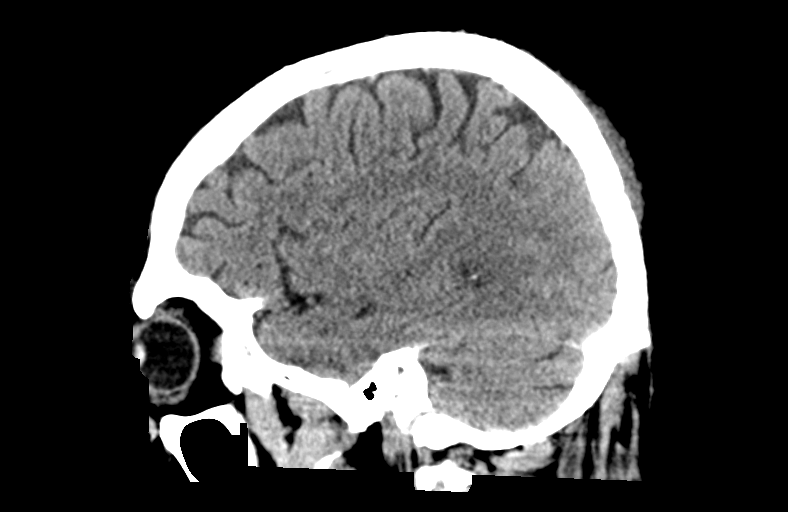

[14 of 47 positions shown; findings below may reference images not displayed]

FINDINGS: CT HEAD FINDINGS

Brain: No evidence of acute infarction, hemorrhage, hydrocephalus,
extra-axial collection or mass lesion/mass effect.

Vascular: No hyperdense vessel identified.

Skull: No acute fracture.  High posterior scalp contusion.

Sinuses/Orbits: Mild paranasal sinus mucosal thickening. No acute
orbital findings.

Other: No mastoid effusions.

CT CERVICAL SPINE FINDINGS

Alignment: No substantial sagittal subluxation.

Skull base and vertebrae: Vertebral body heights are maintained. No
evidence of acute fracture.

Soft tissues and spinal canal: No prevertebral fluid or swelling. No
visible canal hematoma.

Disc levels:  No significant focal bony degenerative change.

Upper chest: Visualized lung apices are clear.
IMPRESSION: 1. No evidence of acute intracranial abnormality.
2. High posterior scalp contusion without acute calvarial fracture.
3. No evidence of acute fracture or traumatic malalignment in the
cervical spine.

## 2024-02-18 ENCOUNTER — Emergency Department (HOSPITAL_COMMUNITY)
Admission: EM | Admit: 2024-02-18 | Discharge: 2024-02-18 | Disposition: A | Discharge status: Home / Self Care | Payer: Self-pay | Attending: Emergency Medicine | Admitting: Emergency Medicine

## 2024-02-18 ENCOUNTER — Other Ambulatory Visit: Payer: Self-pay

## 2024-02-18 ENCOUNTER — Encounter (HOSPITAL_COMMUNITY): Payer: Self-pay | Admitting: Emergency Medicine

## 2024-02-18 DIAGNOSIS — Z8673 Personal history of transient ischemic attack (TIA), and cerebral infarction without residual deficits: Secondary | ICD-10-CM | POA: Insufficient documentation

## 2024-02-18 DIAGNOSIS — R21 Rash and other nonspecific skin eruption: Secondary | ICD-10-CM | POA: Insufficient documentation

## 2024-02-18 DIAGNOSIS — F1010 Alcohol abuse, uncomplicated: Secondary | ICD-10-CM | POA: Insufficient documentation

## 2024-02-18 MED ORDER — LORATADINE 10 MG PO TABS
ORAL_TABLET | ORAL | Status: AC
  Filled 2024-02-18: qty 1

## 2024-02-18 MED ORDER — THIAMINE MONONITRATE 100 MG PO TABS
ORAL_TABLET | ORAL | Status: AC
  Filled 2024-02-18: qty 1

## 2024-02-18 MED ORDER — THIAMINE MONONITRATE 100 MG PO TABS
100.0000 mg | ORAL_TABLET | Freq: Once | ORAL | Status: AC
  Administered 2024-02-18: 100 mg via ORAL

## 2024-02-18 MED ORDER — LORATADINE 10 MG PO TABS
10.0000 mg | ORAL_TABLET | Freq: Once | ORAL | Status: AC
  Administered 2024-02-18: 10 mg via ORAL

## 2024-02-18 MED ORDER — LORATADINE 10 MG PO TABS: 10.0000 mg | ORAL_TABLET | Freq: Every day | ORAL | 0 refills | Status: AC

## 2024-02-18 MED ORDER — FOLIC ACID 1 MG PO TABS: 1.0000 mg | ORAL_TABLET | Freq: Every day | ORAL | 1 refills | Status: AC

## 2024-02-18 MED ORDER — ADULT MULTIVITAMIN W/MINERALS CH
1.0000 | ORAL_TABLET | Freq: Once | ORAL | Status: AC
  Administered 2024-02-18: 1 via ORAL

## 2024-02-18 MED ORDER — FOLIC ACID 1 MG PO TABS
1.0000 mg | ORAL_TABLET | Freq: Once | ORAL | Status: AC
  Administered 2024-02-18: 1 mg via ORAL

## 2024-02-18 MED ORDER — FOLIC ACID 1 MG PO TABS
ORAL_TABLET | ORAL | Status: AC
  Filled 2024-02-18: qty 1

## 2024-02-18 MED ORDER — THIAMINE HCL 100 MG PO TABS
100.0000 mg | ORAL_TABLET | Freq: Every day | ORAL | 0 refills | Status: AC
Start: 2024-02-18 — End: ?

## 2024-02-18 MED ORDER — ADULT MULTIVITAMIN W/MINERALS CH
ORAL_TABLET | ORAL | Status: AC
  Filled 2024-02-18: qty 1

## 2024-02-18 NOTE — ED Provider Notes (Signed)
 [place] EMERGENCY DEPARTMENT AT [facility] Provider Note   CSN: [id number] Arrival date & time: 02/18/24  2021     Patient presents with: Skin Lesions and Alcohol Intoxication   Jose Yu is a 33 y.o. male.    Alcohol Intoxication   Patient has a history of prior stroke causing right upper extremity weakness.  Patient presents ED with complaints of a rash ongoing for at least the last 6 to 9 months.  Patient states it is very itchy in nature.  He has noticed patches of skin that are dry and scaling.  Patient states the rash is always itching it affects his sleep.  Patient states previously was incarcerated so has not seen anyone for this.  Patient does admit to alcohol use.  He states he drinks to help himself sleep.  He denies any fevers or chills.  No chest pain or shortness of breath.    Prior to Admission medications   Medication Sig Start Date End Date Taking? Authorizing Provider  folic acid  (FOLVITE ) 1 MG tablet Take 1 tablet (1 mg total) by mouth daily. 02/18/24  Yes Randol Simmonds, MD  loratadine  (CLARITIN ) 10 MG tablet Take 1 tablet (10 mg total) by mouth daily. 02/18/24  Yes Randol Simmonds, MD  thiamine  (VITAMIN B1) 100 MG tablet Take 1 tablet (100 mg total) by mouth daily. 02/18/24  Yes Randol Simmonds, MD  acetaminophen  (TYLENOL ) 325 MG tablet Take 2 tablets (650 mg total) by mouth every 6 (six) hours as needed for mild pain (or Fever >/= 101). 03/17/22   Regalado, Belkys A, MD  HYDROcodone -acetaminophen  (NORCO/VICODIN) 5-325 MG tablet Take 1-2 tablets by mouth every 4 (four) hours as needed for moderate pain. 03/17/22   Regalado, Belkys A, MD  hydrocortisone  cream 1 % Apply topically as needed for itching (Apply 2 to 3 times daily as needed to skin lesions except the lesion on the neck.). 03/17/22   Regalado, Belkys A, MD  Multiple Vitamin (MULTIVITAMIN WITH MINERALS) TABS tablet Take 1 tablet by mouth daily. 03/17/22   Regalado, Belkys A, MD  mupirocin  ointment  (BACTROBAN ) 2 % Place 1 Application into the nose 2 (two) times daily. 03/16/22   Regalado, Owen LABOR, MD    Allergies: Patient has no known allergies.    Review of Systems  Updated Vital Signs BP (!) 142/90   Pulse 94   Temp 98.5 F (36.9 C)   Resp 18   Wt 66.3 kg   SpO2 96%   BMI 25.09 kg/m   Physical Exam Vitals and nursing note reviewed.  Constitutional:      Appearance: He is well-developed. He is not diaphoretic.  HENT:     Head: Normocephalic and atraumatic.     Comments: Patches of erythema along the scalp with some scaling of the skin    Right Ear: External ear normal.     Left Ear: External ear normal.  Eyes:     General: No scleral icterus.       Right eye: No discharge.        Left eye: No discharge.     Conjunctiva/sclera: Conjunctivae normal.  Neck:     Trachea: No tracheal deviation.  Cardiovascular:     Rate and Rhythm: Normal rate and regular rhythm.  Pulmonary:     Effort: Pulmonary effort is normal. No respiratory distress.     Breath sounds: Normal breath sounds. No stridor. No wheezing or rales.  Abdominal:     General: Bowel sounds  are normal. There is no distension.     Palpations: Abdomen is soft.     Tenderness: There is no abdominal tenderness. There is no guarding or rebound.  Musculoskeletal:        General: No tenderness or deformity.     Cervical back: Neck supple.  Skin:    General: Skin is warm and dry.     Findings: Rash present.     Comments: Will patches several centimeter in size of dry skin but is also erythematous and scaling, well demarcated, no pustules, no urticaria, no vesicles  Neurological:     General: No focal deficit present.     Mental Status: He is alert.     Cranial Nerves: No cranial nerve deficit, dysarthria or facial asymmetry.     Sensory: No sensory deficit.     Motor: No abnormal muscle tone or seizure activity.     Coordination: Coordination normal.  Psychiatric:        Mood and Affect: Mood normal.      (all labs ordered are listed, but only abnormal results are displayed) Labs Reviewed - No data to display  EKG: None  Radiology: No results found.   Procedures   Medications Ordered in the ED  thiamine  (VITAMIN B1) tablet 100 mg (has no administration in time range)  folic acid  (FOLVITE ) tablet 1 mg (has no administration in time range)  multivitamin with minerals tablet 1 tablet (has no administration in time range)  loratadine  (CLARITIN ) tablet 10 mg (has no administration in time range)                                    Medical Decision Making Risk OTC drugs.   Patient has a rash on his skin ongoing for at least several months.  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  Less likely some type of contact dermatitis or eczema.  Patient also does drink alcohol.  Admits to drinking alcohol today.  It is possible some type of vitamin deficiency could also be contributing to his skin condition.  Commend the patient avoid alcohol.  Will have him start vitamin supplements such folate and thiamine .  Will have him follow-up with a primary care doctor this.  Will have him start taking antihistamine counseled  him on alcohol cessation     Final diagnoses:  Rash    ED Discharge Orders          Ordered    folic acid  (FOLVITE ) 1 MG tablet  Daily        02/18/24 2224    loratadine  (CLARITIN ) 10 MG tablet  Daily        02/18/24 2224    thiamine  (VITAMIN B1) 100 MG tablet  Daily        02/18/24 2224               Randol Simmonds, MD 02/18/24 2230
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
## 2024-02-18 NOTE — Discharge Instructions (Signed)
 Take the medications as prescribed to help with itching and rash.  Follow-up with a primary care doctor and consider seeing a dermatologist for further evaluation.  Try to cut down on your alcohol consumption as can be contributing to the skin condition.
